# Patient Record
Sex: Female | Born: 1985 | Race: Black or African American | Hispanic: No | Marital: Single | Smoking: Never smoker
Health system: Southern US, Community
[De-identification: ages and names within clinical notes are randomized; demographics above are authoritative.]

## PROBLEM LIST (undated history)

## (undated) ENCOUNTER — Inpatient Hospital Stay (HOSPITAL_COMMUNITY): Payer: Self-pay

## (undated) DIAGNOSIS — J45909 Unspecified asthma, uncomplicated: Secondary | ICD-10-CM

## (undated) HISTORY — PX: NO PAST SURGERIES: SHX2092

---

## 2008-11-08 ENCOUNTER — Observation Stay: Payer: Self-pay

## 2008-11-14 ENCOUNTER — Observation Stay: Payer: Self-pay | Admitting: Unknown Physician Specialty

## 2009-01-03 ENCOUNTER — Observation Stay: Payer: Self-pay

## 2009-01-22 ENCOUNTER — Inpatient Hospital Stay: Payer: Self-pay

## 2010-09-01 ENCOUNTER — Inpatient Hospital Stay: Payer: Self-pay | Admitting: Obstetrics & Gynecology

## 2014-09-26 ENCOUNTER — Emergency Department: Payer: Self-pay | Admitting: Emergency Medicine

## 2014-12-07 ENCOUNTER — Emergency Department: Payer: Self-pay | Admitting: Emergency Medicine

## 2014-12-08 LAB — URINALYSIS, COMPLETE
Bilirubin,UR: NEGATIVE
Glucose,UR: NEGATIVE mg/dL (ref 0–75)
Ketone: NEGATIVE
NITRITE: NEGATIVE
Ph: 6 (ref 4.5–8.0)
SPECIFIC GRAVITY: 1.02 (ref 1.003–1.030)
WBC UR: 1632 /HPF (ref 0–5)

## 2014-12-09 LAB — URINE CULTURE

## 2015-05-08 ENCOUNTER — Emergency Department: Payer: Self-pay

## 2015-05-08 DIAGNOSIS — O200 Threatened abortion: Secondary | ICD-10-CM | POA: Insufficient documentation

## 2015-05-08 DIAGNOSIS — Z3A01 Less than 8 weeks gestation of pregnancy: Secondary | ICD-10-CM | POA: Insufficient documentation

## 2015-05-08 DIAGNOSIS — Y9289 Other specified places as the place of occurrence of the external cause: Secondary | ICD-10-CM | POA: Insufficient documentation

## 2015-05-08 DIAGNOSIS — W01198A Fall on same level from slipping, tripping and stumbling with subsequent striking against other object, initial encounter: Secondary | ICD-10-CM | POA: Insufficient documentation

## 2015-05-08 DIAGNOSIS — Z043 Encounter for examination and observation following other accident: Secondary | ICD-10-CM | POA: Insufficient documentation

## 2015-05-08 DIAGNOSIS — Y998 Other external cause status: Secondary | ICD-10-CM | POA: Insufficient documentation

## 2015-05-08 DIAGNOSIS — Y9389 Activity, other specified: Secondary | ICD-10-CM | POA: Insufficient documentation

## 2015-05-08 LAB — URINALYSIS COMPLETE WITH MICROSCOPIC (ARMC ONLY)
BILIRUBIN URINE: NEGATIVE
Bacteria, UA: NONE SEEN
Glucose, UA: NEGATIVE mg/dL
Hgb urine dipstick: NEGATIVE
Nitrite: NEGATIVE
PROTEIN: NEGATIVE mg/dL
SPECIFIC GRAVITY, URINE: 1.027 (ref 1.005–1.030)
pH: 6 (ref 5.0–8.0)

## 2015-05-08 LAB — CBC
HEMATOCRIT: 39.8 % (ref 35.0–47.0)
HEMOGLOBIN: 13 g/dL (ref 12.0–16.0)
MCH: 27.7 pg (ref 26.0–34.0)
MCHC: 32.7 g/dL (ref 32.0–36.0)
MCV: 84.9 fL (ref 80.0–100.0)
Platelets: 255 10*3/uL (ref 150–440)
RBC: 4.69 MIL/uL (ref 3.80–5.20)
RDW: 13.2 % (ref 11.5–14.5)
WBC: 10.6 10*3/uL (ref 3.6–11.0)

## 2015-05-08 LAB — POCT PREGNANCY, URINE: Preg Test, Ur: POSITIVE — AB

## 2015-05-08 NOTE — ED Notes (Signed)
Pt presents to ER alert and in NAD. Pt states she thinks she is pregnant. Pt states she has been having vaginal bleeding since fall. Pt has not sought medical care for pregnancy.

## 2015-05-09 ENCOUNTER — Emergency Department
Admission: EM | Admit: 2015-05-09 | Discharge: 2015-05-09 | Disposition: A | Discharge status: Home / Self Care | Payer: Self-pay | Attending: Emergency Medicine | Admitting: Emergency Medicine

## 2015-05-09 DIAGNOSIS — O209 Hemorrhage in early pregnancy, unspecified: Secondary | ICD-10-CM

## 2015-05-09 DIAGNOSIS — O200 Threatened abortion: Secondary | ICD-10-CM

## 2015-05-09 LAB — ABO/RH
ABO/RH(D): O POS
ABO/RH(D): O POS

## 2015-05-09 LAB — HCG, QUANTITATIVE, PREGNANCY: hCG, Beta Chain, Quant, S: 8919 m[IU]/mL — ABNORMAL HIGH (ref <5)

## 2015-05-09 MED ORDER — PRENATAL VITAMINS 0.8 MG PO TABS: 1.0000 | ORAL_TABLET | Freq: Every day | ORAL | Status: DC

## 2015-05-09 NOTE — ED Notes (Signed)
Discharge instructions given, pt to drive self home. RX given x 1

## 2015-05-09 NOTE — ED Notes (Signed)
Pt stated she was washing her hair in the shower when she fell tonight. Pt stated that she is pregnant, but unknown how many weeks along.

## 2015-05-09 NOTE — ED Provider Notes (Signed)
[facility] Emergency Department Provider Note  ____________________________________________  Time seen: Approximately 4: 00 AM  I have reviewed the triage vital signs and the nursing notes.   HISTORY  Chief Complaint Fall    HPI Patricia Guerrero is a 29 y.o. female without any medical problems who presents with 1 day of vaginal spotting after a slip and near fall in the shower. Patient says that she was in the shower yesterday when slipped on some of her hair on the bottom of the tub. She said that she was able to stabilize herself before hitting the ground. She did not lose consciousness. She does not complain of any pain at this time. However, she did stated she started to have vaginal spotting afterwards. No belly pain, fluid leakage, discharge or nausea or vomiting. Is concerned that she may be pregnant. Patient is a G3 P2 with 2 previous vaginal deliveries.   No past medical history on file.  There are no active problems to display for this patient.   No past surgical history on file.  No current outpatient prescriptions on file.  Allergies Review of patient's allergies indicates no known allergies.  No family history on file.  Social History History  Substance Use Topics  . Smoking status: Not on file  . Smokeless tobacco: Not on file  . Alcohol Use: Not on file    Review of Systems Constitutional: No fever/chills Eyes: No visual changes. ENT: No sore throat. Cardiovascular: Denies chest pain. Respiratory: Denies shortness of breath. Gastrointestinal: No abdominal pain.  No nausea, no vomiting.  No diarrhea.  No constipation. Genitourinary: Negative for dysuria. Musculoskeletal: Negative for back pain. Skin: Negative for rash. Neurological: Negative for headaches, focal weakness or numbness.  10-point ROS otherwise negative.  ____________________________________________   PHYSICAL EXAM:  VITAL SIGNS: ED Triage Vitals  Enc  Vitals Group     BP 05/08/15 2219 123/75 mmHg     Pulse Rate 05/08/15 2219 74     Resp 05/08/15 2219 18     Temp 05/08/15 2219 98.1 F (36.7 C)     Temp Source 05/08/15 2219 Oral     SpO2 05/08/15 2219 98 %     Weight 05/08/15 2219 150 lb (68.04 kg)     Height 05/08/15 2219 5\' 8"  (1.727 m)     Head Cir --      Peak Flow --      Pain Score --      Pain Loc --      Pain Edu? --      Excl. in GC? --     Constitutional: Alert and oriented. Well appearing and in no acute distress. Eyes: Conjunctivae are normal. PERRL. EOMI. Head: Atraumatic. Nose: No congestion/rhinnorhea. Mouth/Throat: Mucous membranes are moist.  Oropharynx non-erythematous. Neck: No stridor.   Cardiovascular: Normal rate, regular rhythm. Grossly normal heart sounds.  Good peripheral circulation. Respiratory: Normal respiratory effort.  No retractions. Lungs CTAB. Gastrointestinal: Soft and nontender. No distention. No abdominal bruits. No CVA tenderness. Genitourinary: Normal external exam. Bimanual exam with closed cervix. No CMT no uterine or adnexal tenderness palpation. Speculum exam not done secondary to point of discharge. No blood or discharge on the glove at completion of exam.  Musculoskeletal: No lower extremity tenderness nor edema.  No joint effusions. Neurologic:  Normal speech and language. No gross focal neurologic deficits are appreciated. Speech is normal. No gait instability. Skin:  Skin is warm, dry and intact. No rash noted. Psychiatric: Mood and affect  are normal. Speech and behavior are normal.  ____________________________________________   LABS (all labs ordered are listed, but only abnormal results are displayed)  Labs Reviewed  HCG, QUANTITATIVE, PREGNANCY - Abnormal; Notable for the following:    hCG, Beta Chain, Quant, S 8919 (*)    All other components within normal limits  URINALYSIS COMPLETEWITH MICROSCOPIC ([facility] ONLY) - Abnormal; Notable for the following:    Color, Urine  YELLOW (*)    APPearance CLEAR (*)    Ketones, ur 1+ (*)    Leukocytes, UA TRACE (*)    Squamous Epithelial / LPF 0-5 (*)    All other components within normal limits  POCT PREGNANCY, URINE - Abnormal; Notable for the following:    Preg Test, Ur POSITIVE (*)    All other components within normal limits  CBC  ABO/RH  ABO/RH   ____________________________________________  EKG   ____________________________________________  RADIOLOGY  Single intrauterine gestation at 5 weeks and 2 days. Small subchorionic hematoma. ____________________________________________   PROCEDURES    ____________________________________________   INITIAL IMPRESSION / ASSESSMENT AND PLAN / ED COURSE  Pertinent labs & imaging results that were available during my care of the patient were reviewed by me and considered in my medical decision making (see chart for details).  Explained the patient results of ultrasound including the subchorionic hemorrhage. Aware that she may continue to bleed. The bleeding may stop and never happen again. Also aware that this may progress to miscarriage. We'll discharge patient with prenatal vitamins. Follow-up at [facility] where she has been seen in the past. ____________________________________________   FINAL CLINICAL IMPRESSION(S) / ED DIAGNOSES  Acute threatened abortion. Initial visit    Myrna Blazer, MD 05/09/15 [phone]

## 2015-05-09 NOTE — Discharge Instructions (Signed)

## 2015-05-09 NOTE — ED Notes (Signed)
MD at bedside. 

## 2015-06-11 DIAGNOSIS — Z331 Pregnant state, incidental: Secondary | ICD-10-CM | POA: Insufficient documentation

## 2015-07-09 DIAGNOSIS — Z8619 Personal history of other infectious and parasitic diseases: Secondary | ICD-10-CM | POA: Insufficient documentation

## 2015-07-30 DIAGNOSIS — R8271 Bacteriuria: Secondary | ICD-10-CM | POA: Insufficient documentation

## 2015-08-04 ENCOUNTER — Encounter: Payer: Self-pay | Admitting: Emergency Medicine

## 2015-08-04 ENCOUNTER — Emergency Department
Admission: EM | Admit: 2015-08-04 | Discharge: 2015-08-04 | Disposition: A | Discharge status: Home / Self Care | Payer: Medicaid Other | Attending: Emergency Medicine | Admitting: Emergency Medicine

## 2015-08-04 ENCOUNTER — Emergency Department: Payer: Medicaid Other

## 2015-08-04 DIAGNOSIS — R109 Unspecified abdominal pain: Secondary | ICD-10-CM | POA: Diagnosis not present

## 2015-08-04 DIAGNOSIS — O26899 Other specified pregnancy related conditions, unspecified trimester: Secondary | ICD-10-CM

## 2015-08-04 DIAGNOSIS — O9989 Other specified diseases and conditions complicating pregnancy, childbirth and the puerperium: Secondary | ICD-10-CM | POA: Diagnosis present

## 2015-08-04 DIAGNOSIS — Z3A17 17 weeks gestation of pregnancy: Secondary | ICD-10-CM | POA: Diagnosis not present

## 2015-08-04 DIAGNOSIS — Z79899 Other long term (current) drug therapy: Secondary | ICD-10-CM | POA: Diagnosis not present

## 2015-08-04 HISTORY — DX: Unspecified asthma, uncomplicated: J45.909

## 2015-08-04 LAB — URINALYSIS COMPLETE WITH MICROSCOPIC (ARMC ONLY)
Bilirubin Urine: NEGATIVE
Glucose, UA: NEGATIVE mg/dL
Hgb urine dipstick: NEGATIVE
Nitrite: NEGATIVE
PH: 5 (ref 5.0–8.0)
PROTEIN: NEGATIVE mg/dL
Specific Gravity, Urine: 1.028 (ref 1.005–1.030)

## 2015-08-04 LAB — HCG, QUANTITATIVE, PREGNANCY: hCG, Beta Chain, Quant, S: 9041 m[IU]/mL — ABNORMAL HIGH (ref <5)

## 2015-08-04 NOTE — ED Notes (Signed)
Patient to ED with report of 16-[redacted] weeks pregnant with sharp abdominal cramping last night and then heavy vaginal discharge today.

## 2015-08-04 NOTE — ED Provider Notes (Signed)
[facility] Emergency Department Provider Note  ____________________________________________  Time seen: 1805  I have reviewed the triage vital signs and the nursing notes.   HISTORY  Chief Complaint Abdominal Cramping and Vaginal Discharge     HPI Patricia Guerrero is a 29 y.o. female  who is pregnant for the third time and is currently [redacted] weeks along. She reports she began to have lower abdominal cramping last night and through the day. It has improved.   This morning, when she woke up, she went to urinate and when she White, she noticed an increased amount of white discharge. She denies a flow of any sort of liquid. She denies any bleeding. She denies dysuria.  Her prior 2 pregnancies were both full-term without complication.    Past Medical History  Diagnosis Date  . Asthma     There are no active problems to display for this patient.   History reviewed. No pertinent past surgical history.  Current Outpatient Rx  Name  Route  Sig  Dispense  Refill  . amoxicillin (AMOXIL) 500 MG capsule   Oral   Take 500 mg by mouth 3 (three) times daily.         . Prenatal Multivit-Min-Fe-FA (PRENATAL VITAMINS) 0.8 MG tablet   Oral   Take 1 tablet by mouth daily.   30 tablet   0     Allergies Review of patient's allergies indicates no known allergies.  History reviewed. No pertinent family history.  Social History Social History  Substance Use Topics  . Smoking status: Never Smoker   . Smokeless tobacco: None  . Alcohol Use: No    Review of Systems  Constitutional: Negative for fever. ENT: Negative for sore throat. Cardiovascular: Negative for chest pain. Respiratory: History of asthma, Negative for shortness of breath. Gastrointestinal: Negative for abdominal pain, vomiting and diarrhea. Genitourinary: Pregnant, 17 weeks, with cramping. See history of present illness Musculoskeletal: No myalgias or injuries. Skin: Negative for  rash. Neurological: Negative for headaches   10-point ROS otherwise negative.  ____________________________________________   PHYSICAL EXAM:  VITAL SIGNS: ED Triage Vitals  Enc Vitals Group     BP 08/04/15 1714 133/82 mmHg     Pulse Rate 08/04/15 1714 89     Resp 08/04/15 1714 20     Temp 08/04/15 1714 98.6 F (37 C)     Temp Source 08/04/15 1714 Oral     SpO2 08/04/15 1714 100 %     Weight 08/04/15 1714 146 lb (66.225 kg)     Height 08/04/15 1714  (1.753 m)     Head Cir --      Peak Flow --      Pain Score 08/04/15 1715 7     Pain Loc --      Pain Edu? --      Excl. in GC? --     Constitutional: Alert and oriented. Well appearing and in no distress. ENT   Head: Normocephalic and atraumatic.   Nose: No congestion/rhinnorhea. Cardiovascular: Normal rate, regular rhythm, no murmur noted Respiratory:  Normal respiratory effort, no tachypnea.    Breath sounds are clear and equal bilaterally.  Gastrointestinal: Soft, gravid consistent with dates, mild tenderness equal bilaterally in the lower abdomen..  Back: No muscle spasm, no tenderness, no CVA tenderness. Musculoskeletal: No deformity noted. Nontender with normal range of motion in all extremities.  No noted edema. Neurologic:  Normal speech and language. No gross focal neurologic deficits are appreciated.  Skin:  Skin is warm, dry. No rash noted. Psychiatric: Mood and affect are normal. Speech and behavior are normal.  ____________________________________________    LABS (pertinent positives/negatives)  HCG level of 9041  UA: No white blood cells. 1+ ketones. Leukocyte esterase 3+. ____________________________________________    RADIOLOGY  Pelvic ultrasound:  IMPRESSION: 1. Single live intrauterine pregnancy with a measured gestational age of [redacted] weeks and 1 day 2. Placental previa. 3. No other  abnormality.  ____________________________________________  ____________________________________________   INITIAL IMPRESSION / ASSESSMENT AND PLAN / ED COURSE  Pertinent labs & imaging results that were available during my care of the patient were reviewed by me and considered in my medical decision making (see chart for details).  41 her old female G3 P2 A0 now at 17 weeks. She is having lower abdominal cramping. Her hCG level is approximately 9000. This is essentially the same level as it was when she was 5 weeks and seen here previously with an ultrasound that showed an IUP. This is worrisome for a pregnancy that has failed to progress. We will obtain an ultrasound to further evaluate the pregnancy.  Of note, on her prior visit she was O positive for blood type.  ----------------------------------------- 8:22 PM on 08/04/2015 -----------------------------------------  The ultrasound shows a viable pregnancy at 17 weeks 2 days. I have offered the patient reassurance after likely having made her quite nervous as we discussed her hCG level and my concerns for her pregnancy.   ----------------------------------------- 9:39 PM on 08/04/2015 -----------------------------------------  Pelvic exam: Normal external female genitalia. Patient has slightly heavy but physiologic discharge consistent with the discharge of pregnancy. No blood. No thin secretions consistent with rupture of membranes. No tenderness on exam.   ____________________________________________   FINAL CLINICAL IMPRESSION(S) / ED DIAGNOSES  Final diagnoses:  Abdominal pain in pregnancy  placenta previa    Darien Ramus, MD 08/04/15 2140

## 2015-08-04 NOTE — ED Notes (Signed)
Patient began having lower abdominal cramping last night. After urinating this AM, had heavy amount of clear/white thick discharge. Still having some discharge when urinating. Denies any UTI symptoms.

## 2015-08-04 NOTE — Discharge Instructions (Signed)
While your pregnancy hormone seemed low (9000), your ultrasound looks good with a gestational age of [redacted] weeks 2 days and normal fetal heart activity. The ultrasound did show that you have placenta previa. Follow-up with your obstetrician's at [facility]. Return to the emergency department if you have worsening cramping, if you have vaginal bleeding, or if you have other urgent concerns.  Abdominal Pain During Pregnancy Abdominal pain is common in pregnancy. Most of the time, it does not cause harm. There are many causes of abdominal pain. Some causes are more serious than others. Some of the causes of abdominal pain in pregnancy are easily diagnosed. Occasionally, the diagnosis takes time to understand. Other times, the cause is not determined. Abdominal pain can be a sign that something is very wrong with the pregnancy, or the pain may have nothing to do with the pregnancy at all. For this reason, always tell your health care provider if you have any abdominal discomfort. HOME CARE INSTRUCTIONS  Monitor your abdominal pain for any changes. The following actions may help to alleviate any discomfort you are experiencing:  Do not have sexual intercourse or put anything in your vagina until your symptoms go away completely.  Get plenty of rest until your pain improves.  Drink clear fluids if you feel nauseous. Avoid solid food as long as you are uncomfortable or nauseous.  Only take over-the-counter or prescription medicine as directed by your health care provider.  Keep all follow-up appointments with your health care provider. SEEK IMMEDIATE MEDICAL CARE IF:  You are bleeding, leaking fluid, or passing tissue from the vagina.  You have increasing pain or cramping.  You have persistent vomiting.  You have painful or bloody urination.  You have a fever.  You notice a decrease in your baby's movements.  You have extreme weakness or feel faint.  You have shortness of breath, with or without  abdominal pain.  You develop a severe headache with abdominal pain.  You have abnormal vaginal discharge with abdominal pain.  You have persistent diarrhea.  You have abdominal pain that continues even after rest, or gets worse. MAKE SURE YOU:   Understand these instructions.  Will watch your condition.  Will get help right away if you are not doing well or get worse. Document Released: 11/28/2005 Document Revised: 09/18/2013 Document Reviewed: 06/27/2013 [facility] Patient Information 2015 [place], [place]. This information is not intended to replace advice given to you by your health care provider. Make sure you discuss any questions you have with your health care provider.

## 2015-08-04 NOTE — ED Notes (Signed)
Pt went to ultrasound.

## 2015-08-27 DIAGNOSIS — N764 Abscess of vulva: Secondary | ICD-10-CM | POA: Insufficient documentation

## 2015-11-24 ENCOUNTER — Encounter: Payer: Self-pay | Admitting: Family Medicine

## 2015-11-24 ENCOUNTER — Ambulatory Visit (INDEPENDENT_AMBULATORY_CARE_PROVIDER_SITE_OTHER): Payer: Medicaid Other | Admitting: Family Medicine

## 2015-11-24 VITALS — BP 120/70 | HR 82 | Temp 99.2°F | Resp 16 | Ht 69.0 in | Wt 153.2 lb

## 2015-11-24 DIAGNOSIS — Z331 Pregnant state, incidental: Secondary | ICD-10-CM

## 2015-11-24 DIAGNOSIS — J029 Acute pharyngitis, unspecified: Secondary | ICD-10-CM

## 2015-11-24 DIAGNOSIS — Z3490 Encounter for supervision of normal pregnancy, unspecified, unspecified trimester: Secondary | ICD-10-CM

## 2015-11-24 DIAGNOSIS — J4520 Mild intermittent asthma, uncomplicated: Secondary | ICD-10-CM

## 2015-11-24 LAB — POCT RAPID STREP A (OFFICE): Rapid Strep A Screen: NEGATIVE

## 2015-11-24 MED ORDER — ALBUTEROL SULFATE HFA 108 (90 BASE) MCG/ACT IN AERS: 2.0000 | INHALATION_SPRAY | Freq: Four times a day (QID) | RESPIRATORY_TRACT | Status: DC | PRN

## 2015-11-24 NOTE — Progress Notes (Signed)
Name: Patricia Guerrero   MRN: [id number]    DOB: 1986/11/02   Date:11/24/2015       Progress Note  Subjective  Chief Complaint  Chief Complaint  Patient presents with  . Sore Throat    for 3 days, Patient [redacted] weeks pregnant    HPI  Pharyngitis history of present illness  Patient is [redacted] weeks pregnant and presents with a three-day history of sore throat pain. She is febrile to 99.2 today and has been experiencing chills. She has some nasal drainage and discharge as well which is clear.  Asthma  Patient has a history of mild intermittent asthma. She is currently out of her inhaler she's been having minimal cough and wheezing.  Intrauterine pregnancy  Patient is currently pregnant [redacted] weeks. She states the pregnancy is going well and she has been followed at [facility] for this pregnancy. No complications or high risk for her report.    Past Medical History  Diagnosis Date  . Asthma     Social History  Substance Use Topics  . Smoking status: Never Smoker   . Smokeless tobacco: Not on file  . Alcohol Use: No     Current outpatient prescriptions:  .  Prenatal Multivit-Min-Fe-FA (PRENATAL VITAMINS) 0.8 MG tablet, Take 1 tablet by mouth daily., Disp: 30 tablet, Rfl: 0  No Known Allergies  Review of Systems  Constitutional: Positive for fever and chills. Negative for weight loss.  HENT: Positive for sore throat. Negative for congestion, hearing loss and tinnitus.   Eyes: Negative for blurred vision, double vision and redness.  Respiratory: Positive for cough and wheezing. Negative for hemoptysis and shortness of breath.   Cardiovascular: Negative for chest pain, palpitations, orthopnea, claudication and leg swelling.  Gastrointestinal: Negative for heartburn, nausea, vomiting, diarrhea, constipation and blood in stool.  Genitourinary: Negative for dysuria, urgency, frequency and hematuria.       33 weeks pregnancy  Musculoskeletal: Negative for myalgias, back pain,  joint pain, falls and neck pain.  Skin: Negative for itching.  Neurological: Negative for dizziness, tingling, tremors, focal weakness, seizures, loss of consciousness, weakness and headaches.  Endo/Heme/Allergies: Does not bruise/bleed easily.  Psychiatric/Behavioral: Negative for depression and substance abuse. The patient is not nervous/anxious and does not have insomnia.      Objective  Filed Vitals:   11/24/15 0856  BP: 120/70  Pulse: 82  Temp: 99.2 F (37.3 C)  TempSrc: Oral  Resp: 16  Height:  (1.753 m)  Weight: 153 lb 3.2 oz (69.491 kg)  SpO2: 98%     Physical Exam  Constitutional: She is oriented to person, place, and time and well-developed, well-nourished, and in no distress.  HENT:  Head: Normocephalic.  Mild pharyngeal injection without exudate  Eyes: EOM are normal. Pupils are equal, round, and reactive to light.  Neck: Normal range of motion. No thyromegaly present.  Cardiovascular: Normal rate, regular rhythm and normal heart sounds.   No murmur heard. Pulmonary/Chest: Effort normal and breath sounds normal.  Abdominal:  Gravid  Musculoskeletal: Normal range of motion. She exhibits no edema.  Lymphadenopathy:    She has no cervical adenopathy.  Neurological: She is alert and oriented to person, place, and time. No cranial nerve deficit. Gait normal.  Skin: Skin is warm and dry. No rash noted.  Psychiatric: Memory and affect normal.      Assessment & Plan   1. Sore throat Symptomatic treatment - POCT rapid strep A  2. Pharyngitis As above  3.  Mild intermittent asthma, uncomplicated Albuterol when necessary - albuterol (PROVENTIL HFA;VENTOLIN HFA) 108 (90 BASE) MCG/ACT inhaler; Inhale 2 puffs into the lungs every 6 (six) hours as needed for wheezing or shortness of breath.  Dispense: 1 Inhaler; Refill: 0  4. Pregnancy Going well and [facility]

## 2015-11-24 NOTE — Patient Instructions (Signed)

## 2015-11-28 LAB — ANAEROBIC AND AEROBIC CULTURE

## 2015-12-16 ENCOUNTER — Telehealth: Payer: Self-pay

## 2015-12-16 NOTE — Telephone Encounter (Signed)
Tried to contact this patient to see if she was interested in getting her influenza vaccine, but there was no answer. A message was left for her to give us a call when she got the chance.  

## 2015-12-20 ENCOUNTER — Observation Stay
Admission: EM | Admit: 2015-12-20 | Discharge: 2015-12-20 | Disposition: A | Discharge status: Home / Self Care | Payer: Medicaid Other | Attending: Certified Nurse Midwife | Admitting: Certified Nurse Midwife

## 2015-12-20 ENCOUNTER — Encounter: Payer: Self-pay | Admitting: *Deleted

## 2015-12-20 DIAGNOSIS — A6009 Herpesviral infection of other urogenital tract: Secondary | ICD-10-CM | POA: Diagnosis not present

## 2015-12-20 DIAGNOSIS — Y9389 Activity, other specified: Secondary | ICD-10-CM | POA: Diagnosis not present

## 2015-12-20 DIAGNOSIS — Y998 Other external cause status: Secondary | ICD-10-CM | POA: Insufficient documentation

## 2015-12-20 DIAGNOSIS — O98313 Other infections with a predominantly sexual mode of transmission complicating pregnancy, third trimester: Secondary | ICD-10-CM | POA: Diagnosis not present

## 2015-12-20 DIAGNOSIS — W002XXA Other fall from one level to another due to ice and snow, initial encounter: Secondary | ICD-10-CM | POA: Diagnosis not present

## 2015-12-20 DIAGNOSIS — Y9289 Other specified places as the place of occurrence of the external cause: Secondary | ICD-10-CM | POA: Insufficient documentation

## 2015-12-20 DIAGNOSIS — Z3A37 37 weeks gestation of pregnancy: Secondary | ICD-10-CM | POA: Diagnosis not present

## 2015-12-20 MED ORDER — LACTATED RINGERS IV SOLN: 500.0000 mL | INTRAVENOUS | Status: DC | PRN

## 2015-12-20 NOTE — Final Progress Note (Signed)
Physician Final Progress Note  Patient ID: Jeris Pentantoinette L Delpriore MRN: [id number] DOB/AGE: 28/03/1986 28 y.o.  Admit date: 12/20/2015 Admitting provider:  Jill Sideolleen L. Sharen HonesGutierrez, CNM Discharge date: 12/20/2015   Admission Diagnoses: IUP at 37 weeks, s/p fall  Discharge Diagnoses: same  Consults: none  Significant Findings/ Diagnostic Studies: 30 year old G5 P2022 with EDC=01/10/2016 presented at 37 weeks after slipping and having 2 falls since last night. Last night she slipped and fell into the snow on hands and knees and this Am at 0530 she slipped on ice and fell on buttock. She went to work after that and then began having some contractions this afternoon around 1-2 PM. Noticed spotting x1 when she wiped earlier. Feels contraction tightening at fundus and LUS, but is talking thru them.  Baby nicely active.  Has drunk very little water today. 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 Nonsmoker. Exam: BP 111/61 mmHg  Pulse 71  Temp(Src) 98.5 F (36.9 C) (Oral)  Resp 16  Ht 5\' 9"  (1.753 m)  Wt 69.4 kg (153 lb)  BMI 22.58 kg/m2  LMP  (LMP Unknown)  FHR: baseline 140 with accelerations to 160s to 170, moderate variability, no decelerations Toco: irregular contractions, q6, q9, q2, mild Abd: soft, NT Ultrasound: anterior placenta, appears normal, cephalic presentation Cervix: FT/TH/-1 to 0. Discharge clear, no bleeding Blood type: O POS A: IUP at 37 weeks s/p fall x2. No evidence of fetal compromise P: Discussed POM with DR Tiburcio PeaHarris DC home with labor and abruption precautions  Procedures: none  Discharge Condition: stable  Disposition: 01-Home or Self Care  Diet: Regular diet  Discharge Activity: Activity as tolerated     Medication List    ASK your doctor about these medications        albuterol 108 (90 Base) MCG/ACT inhaler  Commonly known as:  PROVENTIL  HFA;VENTOLIN HFA  Inhale 2 puffs into the lungs every 6 (six) hours as needed for wheezing or shortness of breath.     Prenatal Vitamins 0.8 MG tablet  Take 1 tablet by mouth daily.          FU at next ROB visit  Signed: Farrel ConnersGUTIERREZ, Porfiria Heinrich 12/20/2015, 5:28 PM

## 2015-12-20 NOTE — OB Triage Note (Signed)
S/P fall X 2. Fell yesterday, 12/19/15 landing on abdomen. Did not seek medical care. Larey SeatFell today landing on her bottom. Reports feeling baby move. Fetal movement audible. Reports contractions. Denies bleeding or leaking of fluid. Does report having vaginal discharge. Elaina HoopsElks, Patricia Guerrero S

## 2016-07-28 IMAGING — US US OB LIMITED
1 series · 14 of 28 positions shown · non-contrast
Comparison: 05/08/2015

CLINICAL DATA: Abdominal cramping.  [REDACTED] weeks pregnant.

EXAM:
LIMITED OBSTETRIC ULTRASOUND

[Series 1: us ob limited · 0.21mm/px · 14 of 30 slices shown]
[im 2/30]
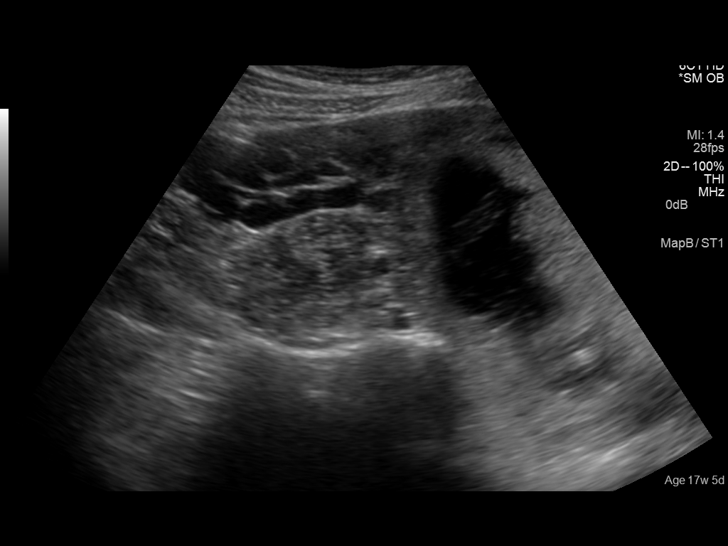
[im 4/30]
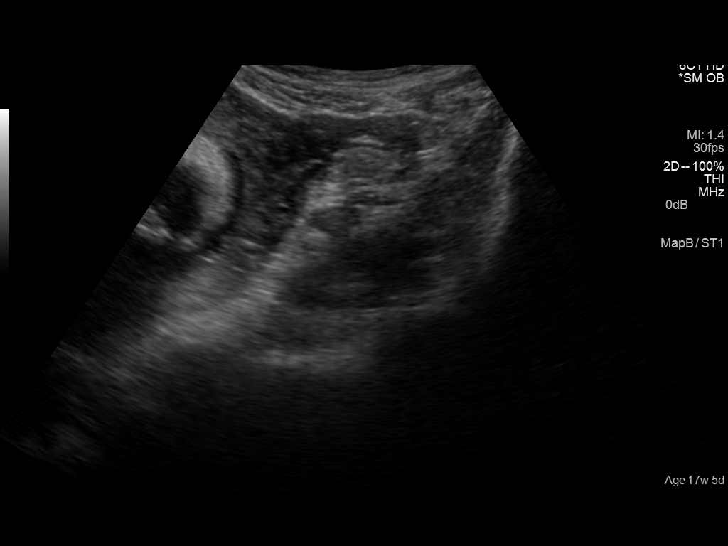
[im 6/30]
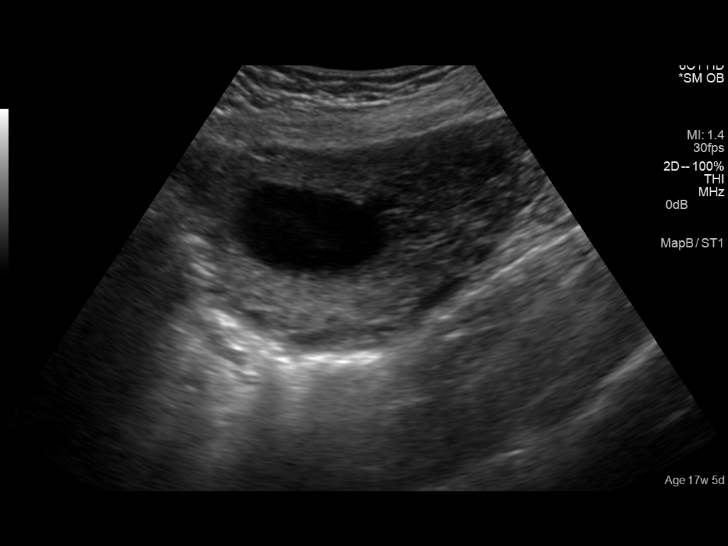
[im 8/30]
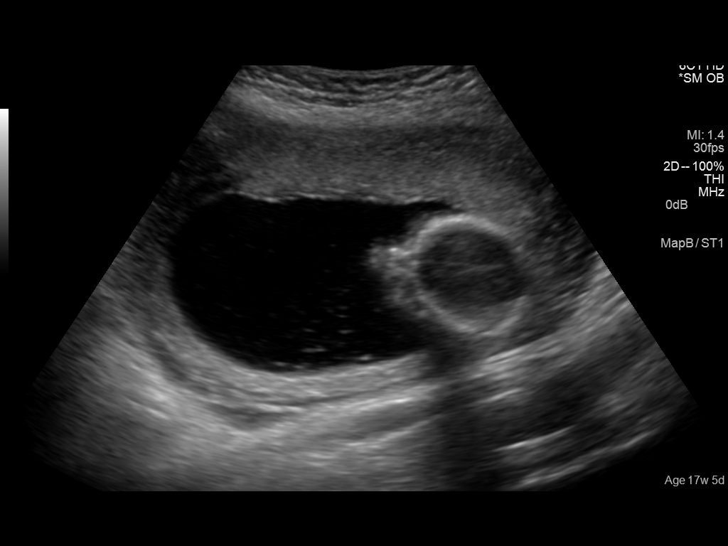
[im 10/30]
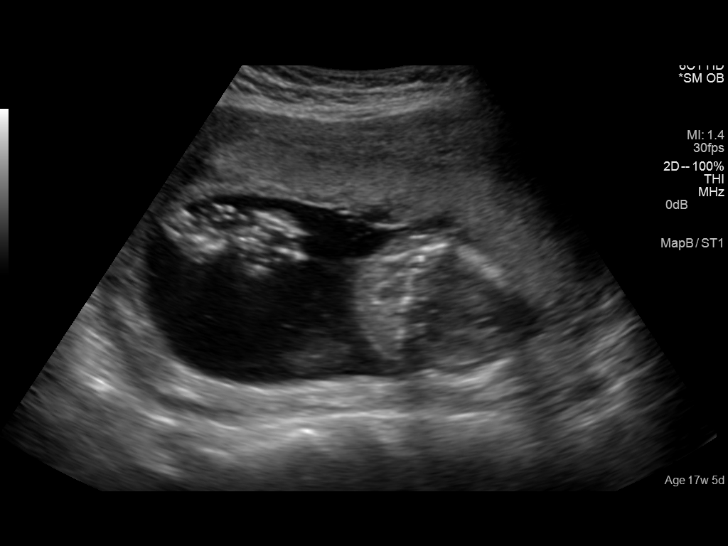
[im 12/30]
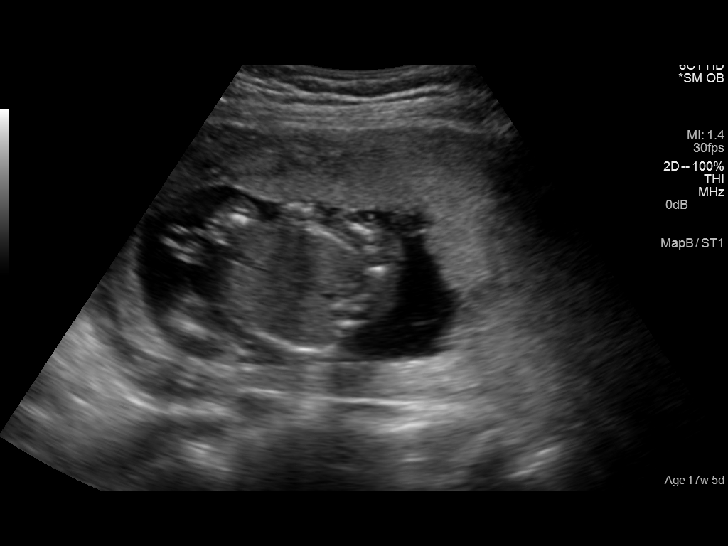
[im 14/30]
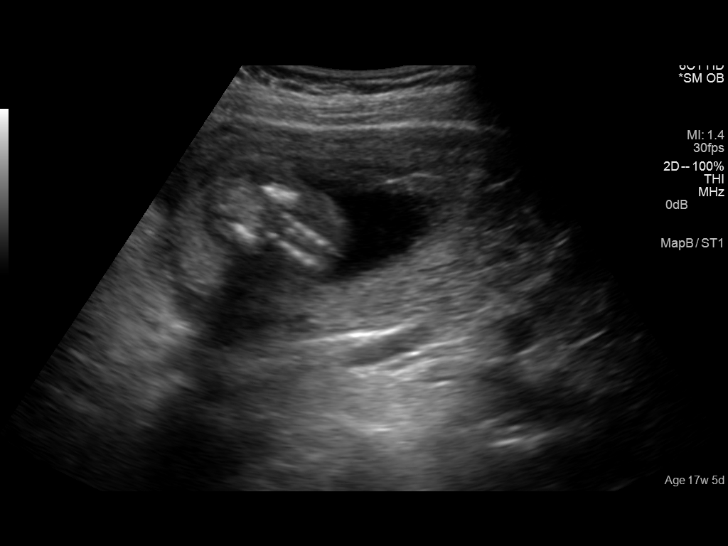
[im 17/30]
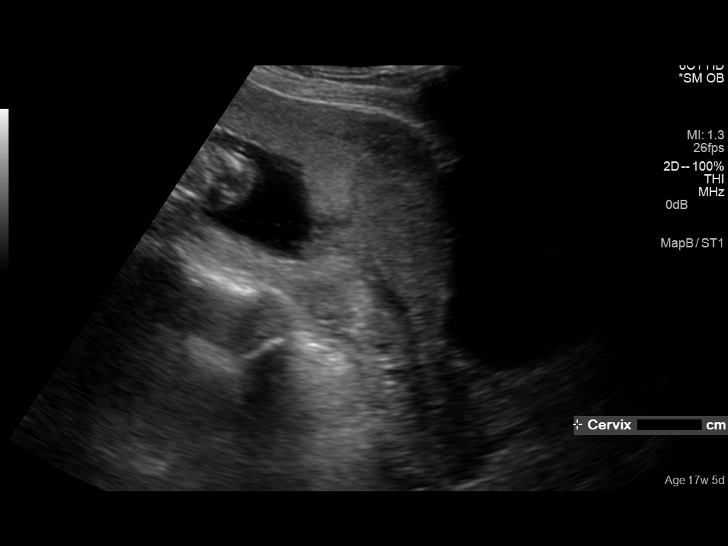
[im 19/30]
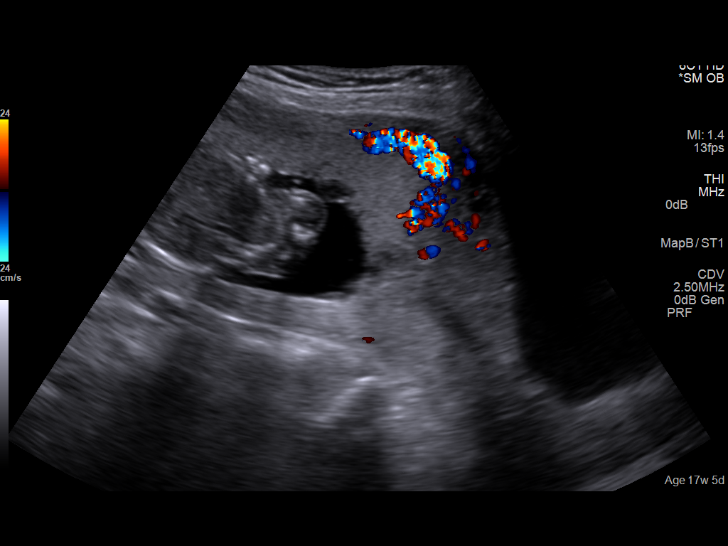
[im 21/30]
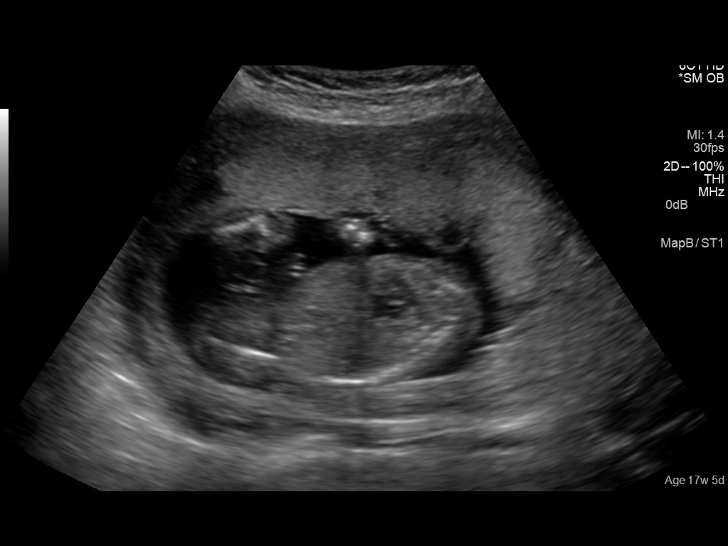
[im 23/30]
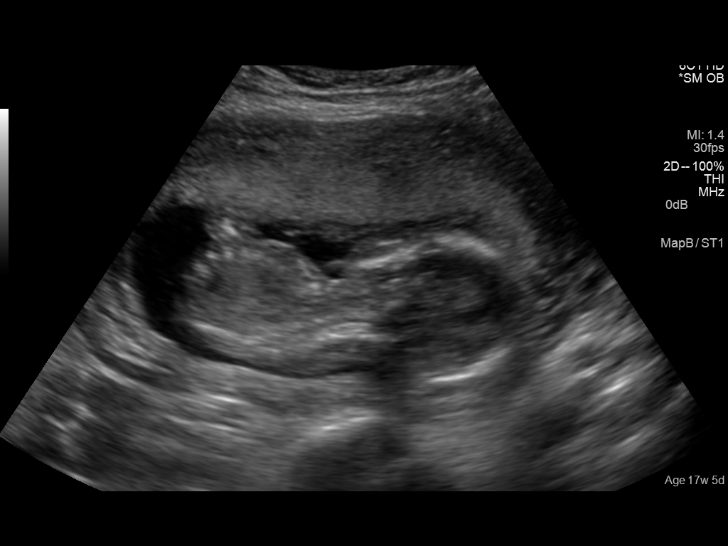
[im 25/30]
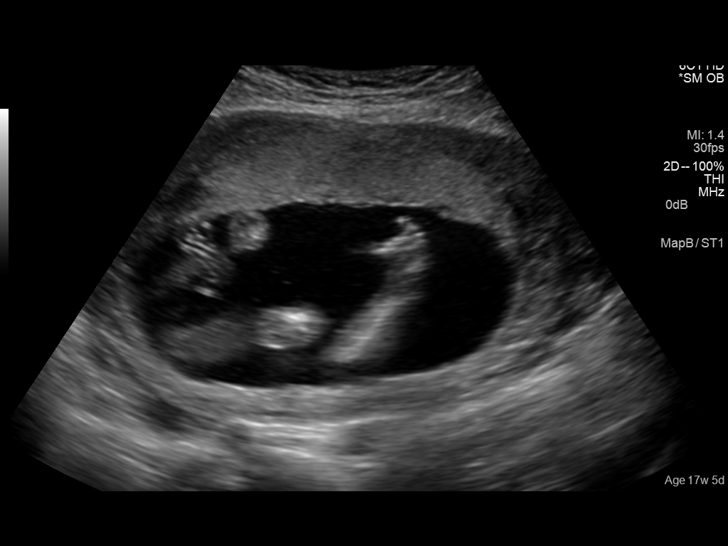
[im 27/30]
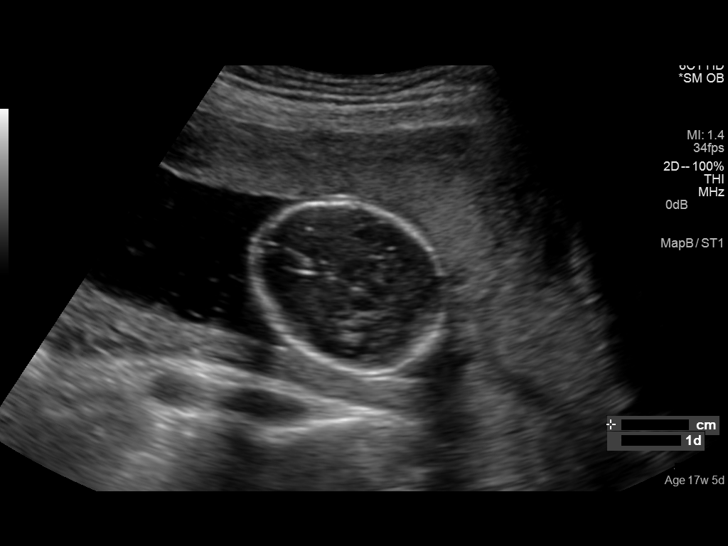
[im 30/30]
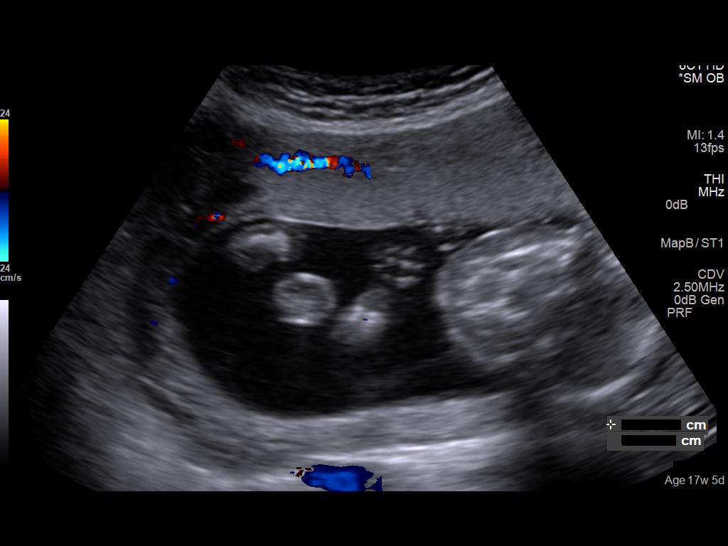

[14 of 28 positions shown; findings below may reference images not displayed]

FINDINGS: Number of Fetuses: 1

Heart Rate:  149 bpm

Movement: Yes

Presentation: Cephalic

Placental Location: Anterior

Previa: Yes, complete covering the internal cervical os. No evidence
of a retroplacental hemorrhage.

Amniotic Fluid (Subjective):  Within normal limits.

BPD:  3.6cm 17w  1d

MATERNAL FINDINGS:

Cervix:  Normal in length, measuring 4 cm.  Closed.

Uterus/Adnexae: No abnormality visualized. Ovaries not visualized.
No free fluid.
IMPRESSION: 1. Single live intrauterine pregnancy with a measured gestational
age of 17 weeks and 1 day
2. Placental previa.
3. No other abnormality.

This exam is performed on an emergent basis and does not
comprehensively evaluate fetal size, dating, or anatomy; follow-up
complete OB US should be considered if further fetal assessment is
warranted.

## 2016-10-24 ENCOUNTER — Encounter (HOSPITAL_COMMUNITY): Payer: Self-pay

## 2017-01-09 ENCOUNTER — Ambulatory Visit: Payer: Medicaid Other | Admitting: Family Medicine

## 2017-01-19 ENCOUNTER — Encounter: Payer: Self-pay | Admitting: Family Medicine

## 2017-01-19 ENCOUNTER — Ambulatory Visit (INDEPENDENT_AMBULATORY_CARE_PROVIDER_SITE_OTHER): Payer: Medicaid Other | Admitting: Family Medicine

## 2017-01-19 VITALS — BP 110/60 | HR 84 | Temp 98.3°F | Resp 16 | Ht 69.0 in | Wt 157.3 lb

## 2017-01-19 DIAGNOSIS — J209 Acute bronchitis, unspecified: Secondary | ICD-10-CM

## 2017-01-19 MED ORDER — AZITHROMYCIN 250 MG PO TABS: ORAL_TABLET | ORAL | 0 refills | Status: DC

## 2017-01-19 MED ORDER — BENZONATATE 200 MG PO CAPS: 200.0000 mg | ORAL_CAPSULE | Freq: Three times a day (TID) | ORAL | 0 refills | Status: DC | PRN

## 2017-01-19 NOTE — Progress Notes (Signed)
Name: Patricia Guerrero   MRN: [id number]    DOB: 05/19/1986   Date:01/19/2017       Progress Note  Subjective  Chief Complaint  Chief Complaint  Patient presents with  . Asthma    cough, headache, sweats, chills for 4 days. Inhaler not working     Cough  This is a new problem. The current episode started in the past 7 days. The problem has been unchanged. The cough is productive of sputum. Associated symptoms include chills, a fever (had a fever two days in a row) and sweats. Pertinent negatives include no myalgias, sore throat or shortness of breath. She has tried OTC cough suppressant (Delsym for cough, Ibuprofen for Headache.) for the symptoms.     Past Medical History:  Diagnosis Date  . Asthma     Past Surgical History:  Procedure Laterality Date  . NO PAST SURGERIES      No family history on file.  Social History   Social History  . Marital status: Married    Spouse name: N/A  . Number of children: N/A  . Years of education: N/A   Occupational History  . Not on file.   Social History Main Topics  . Smoking status: Never Smoker  . Smokeless tobacco: Never Used  . Alcohol use No  . Drug use: No  . Sexual activity: Yes   Other Topics Concern  . Not on file   Social History Narrative  . No narrative on file     Current Outpatient Prescriptions:  .  albuterol (PROVENTIL HFA;VENTOLIN HFA) 108 (90 BASE) MCG/ACT inhaler, Inhale 2 puffs into the lungs every 6 (six) hours as needed for wheezing or shortness of breath., Disp: 1 Inhaler, Rfl: 0  No Known Allergies   Review of Systems  Constitutional: Positive for chills and fever (had a fever two days in a row).  HENT: Negative for sore throat.   Respiratory: Positive for cough. Negative for shortness of breath.   Musculoskeletal: Negative for myalgias.     Objective  Vitals:   01/19/17 1052  BP: 110/60  Pulse: 84  Resp: 16  Temp: 98.3 F (36.8 C)  TempSrc: Oral  SpO2: 95%  Weight: 157 lb 4.8  oz (71.4 kg)  Height: 5\' 9"  (1.753 m)    Physical Exam  Constitutional: She is well-developed, well-nourished, and in no distress.  HENT:  Head: Normocephalic and atraumatic.  Mouth/Throat: No oropharyngeal exudate or posterior oropharyngeal erythema.  Cardiovascular: Normal rate, regular rhythm and normal heart sounds.   No murmur heard. Pulmonary/Chest: Effort normal and breath sounds normal. No respiratory distress. She has no wheezes. She has no rales.  Nursing note and vitals reviewed.     Assessment & Plan  1. Acute bronchitis, unspecified organism Obtain CXR to rule out pneumonia, started on azithromycin and benzonatate for treatment. - DG Chest 2 View; Future - azithromycin (ZITHROMAX) 250 MG tablet; 2 tabs po day 1, then 1 tab po q day x 4 days  Dispense: 6 tablet; Refill: 0 - benzonatate (TESSALON) 200 MG capsule; Take 1 capsule (200 mg total) by mouth 3 (three) times daily as needed for cough.  Dispense: 20 capsule; Refill: 0   Dannis Deroche Asad A. Faylene KurtzShah [facility] 01/19/2017 10:58 AM

## 2017-01-31 IMAGING — US US OB TRANSVAGINAL
1 series · 14 of 28 positions shown · non-contrast
Comparison: None.

CLINICAL DATA: Spotting in first-trimester pregnancy after fall.

EXAM:
OBSTETRIC <14 WK US AND TRANSVAGINAL OB US
TECHNIQUE: Both transabdominal and transvaginal ultrasound examinations were
performed for complete evaluation of the gestation as well as the
maternal uterus, adnexal regions, and pelvic cul-de-sac.
Transvaginal technique was performed to assess early pregnancy.

[Series 1: us ob transvaginal · 0.18mm/px · 14 of 103 slices shown]
[im 4/103]
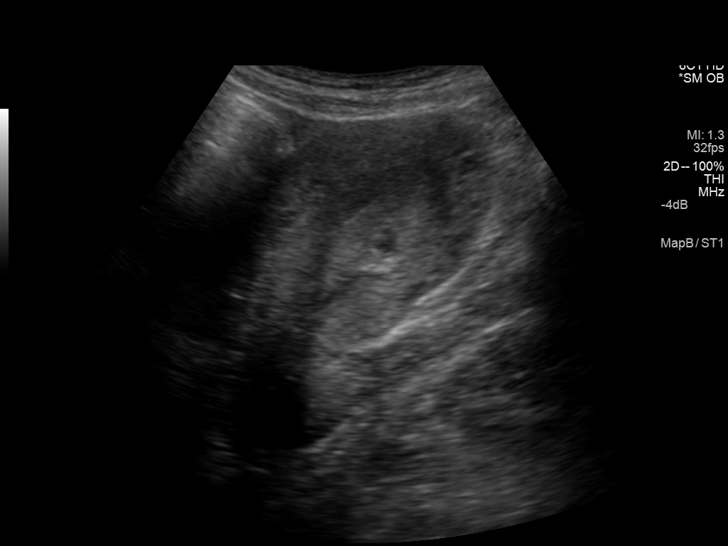
[im 12/103]
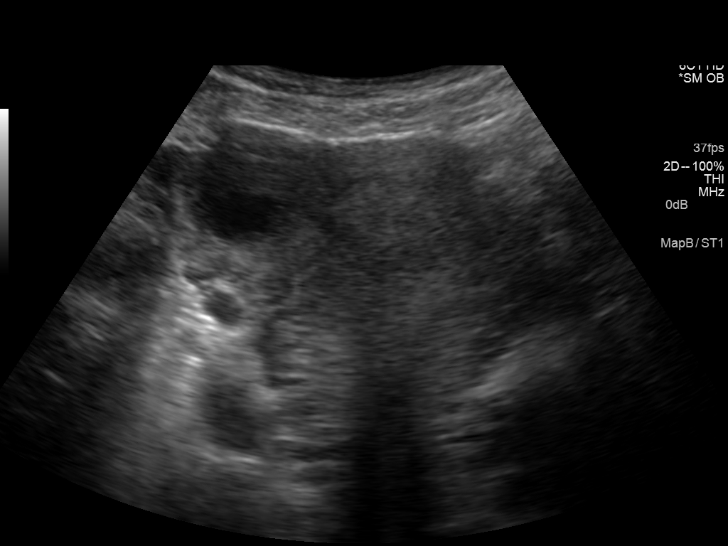
[im 19/103]
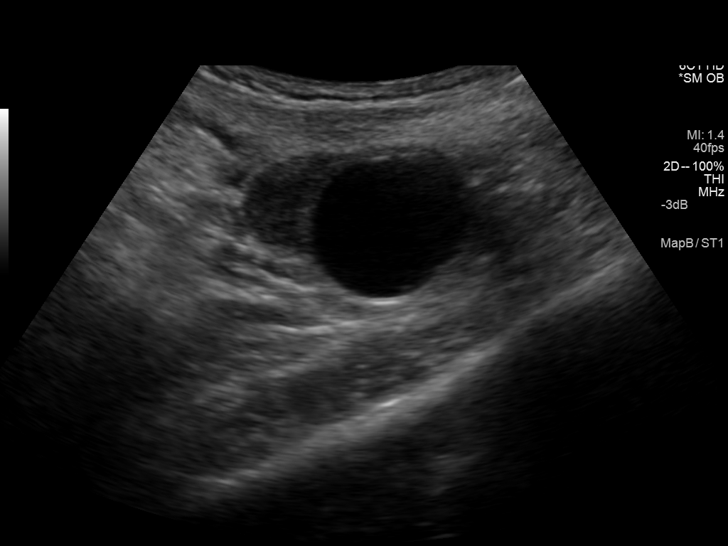
[im 27/103]
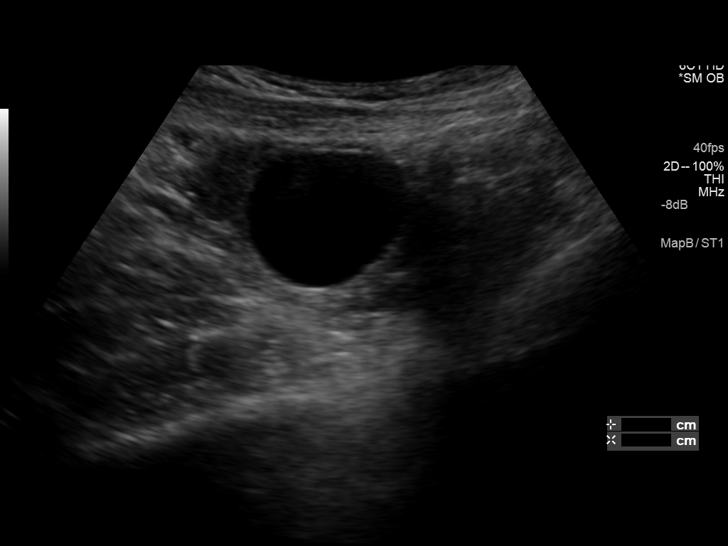
[im 35/103]
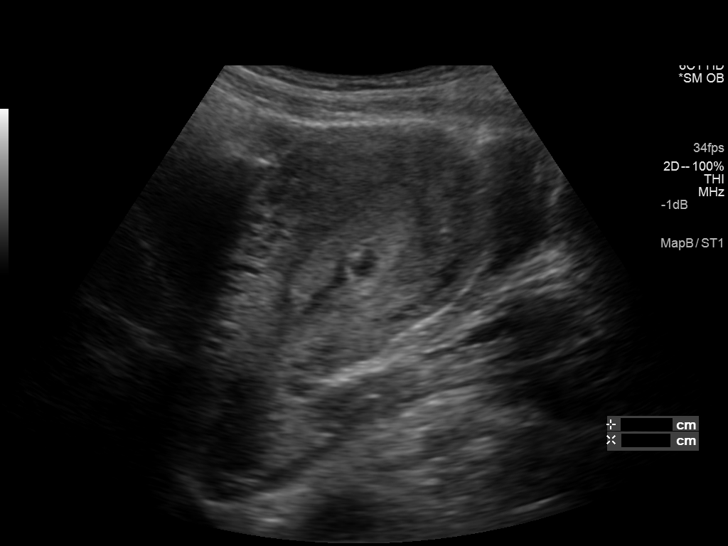
[im 42/103]
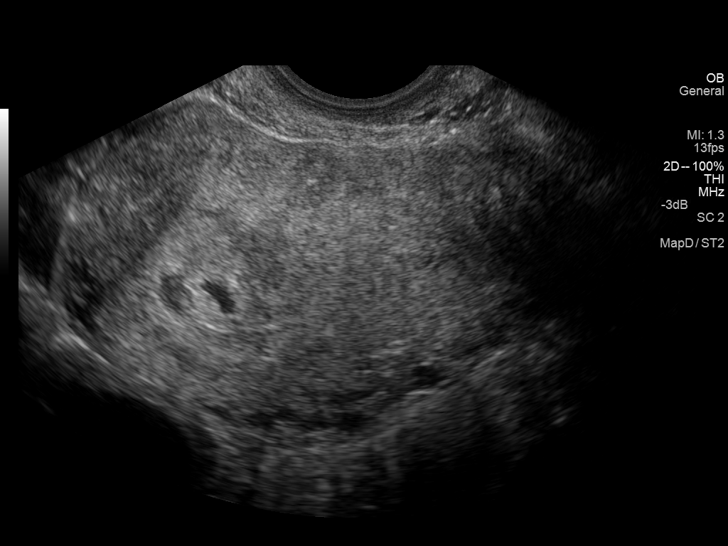
[im 50/103]
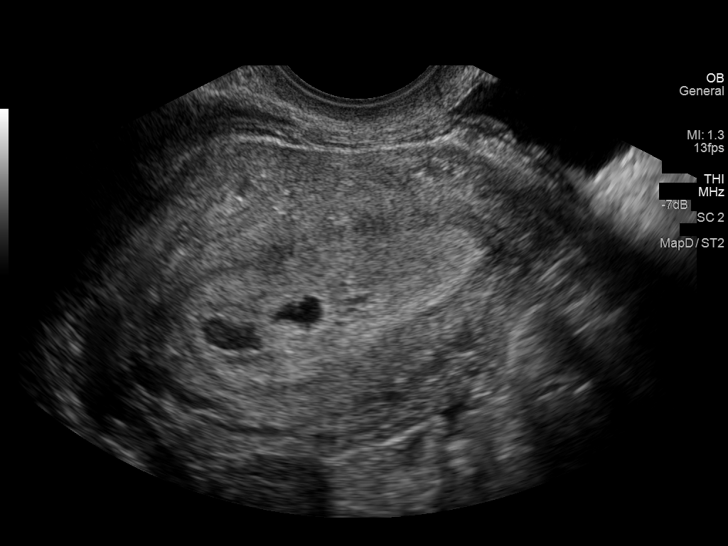
[im 57/103]
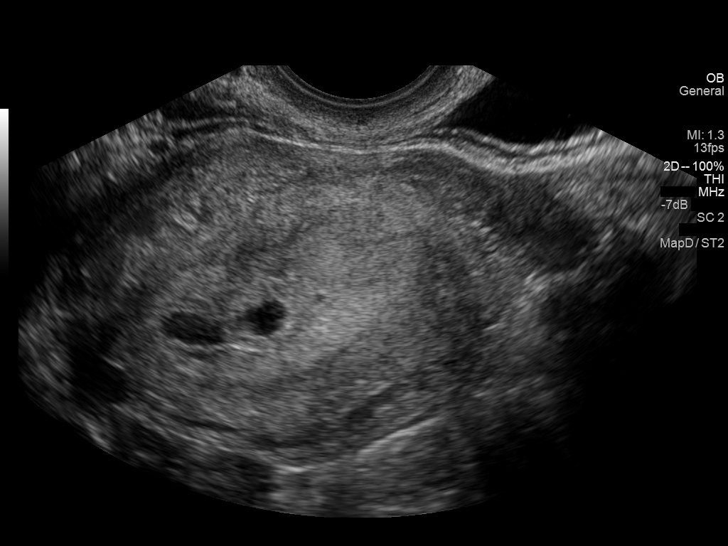
[im 65/103]
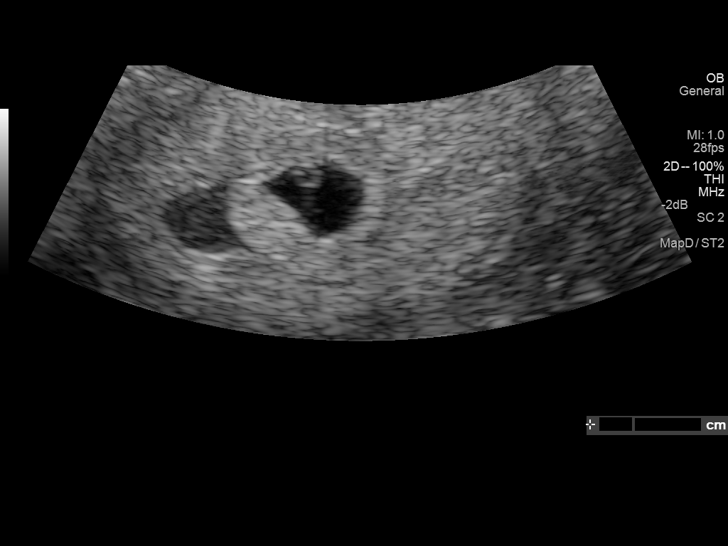
[im 72/103]
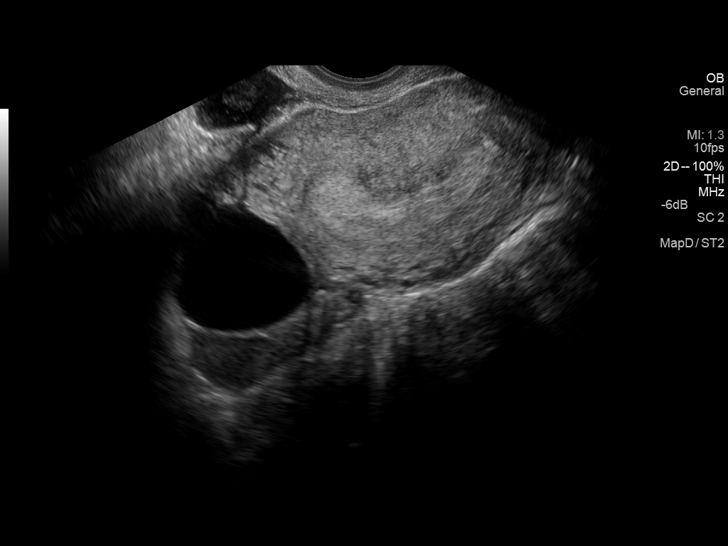
[im 80/103]
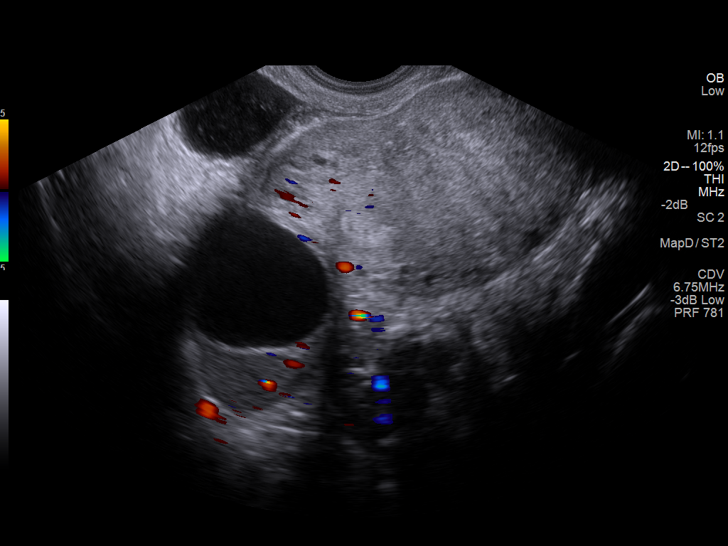
[im 87/103]
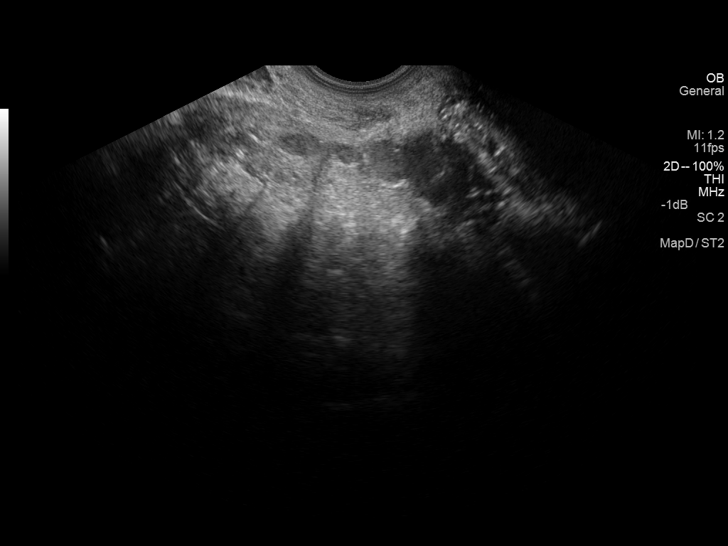
[im 95/103]
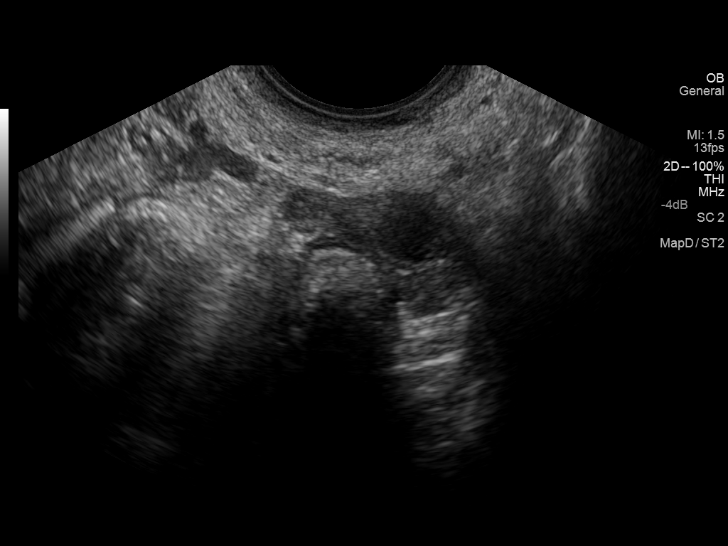
[im 103/103]
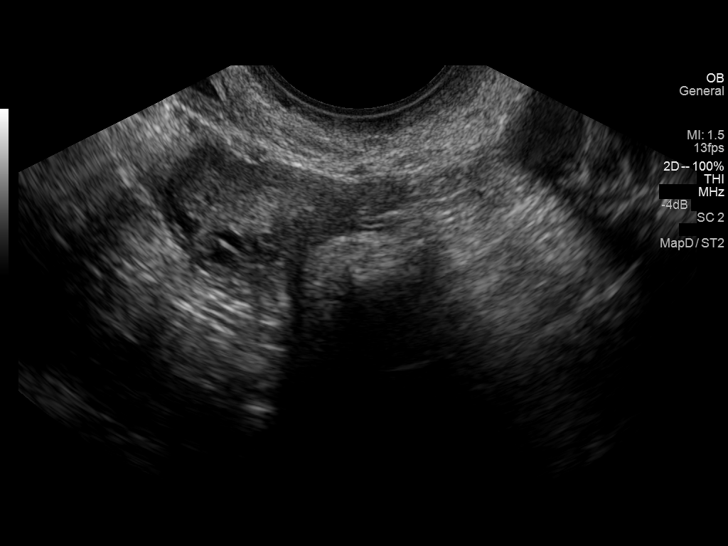

[14 of 28 positions shown; findings below may reference images not displayed]

FINDINGS: Intrauterine gestational sac: Visualized/normal in shape. There is a
small subchorionic hematoma measuring 17 mm in length by 5 mm in
thickness. The hematoma is touches less than 25% of the gestational
sac circumference.

Yolk sac:  Present

Embryo:  Not yet visible

MSD: 6  mm   5 w   2  d

Maternal uterus/adnexae: 3 cm simple cyst in the right ovary. The
left ovary is unremarkable. No free pelvic fluid.
IMPRESSION: 1. Single intrauterine gestation at 5 weeks 2 days.
2. Small subchorionic hematoma, as above.

## 2017-04-04 ENCOUNTER — Ambulatory Visit: Payer: Self-pay | Admitting: Family Medicine

## 2017-04-10 ENCOUNTER — Encounter: Payer: Self-pay | Admitting: Family Medicine

## 2017-04-10 ENCOUNTER — Ambulatory Visit (INDEPENDENT_AMBULATORY_CARE_PROVIDER_SITE_OTHER): Payer: Self-pay | Admitting: Family Medicine

## 2017-04-10 VITALS — BP 114/68 | HR 108 | Temp 98.2°F | Resp 16 | Ht 69.0 in | Wt 156.7 lb

## 2017-04-10 DIAGNOSIS — A6000 Herpesviral infection of urogenital system, unspecified: Secondary | ICD-10-CM | POA: Insufficient documentation

## 2017-04-10 DIAGNOSIS — J4520 Mild intermittent asthma, uncomplicated: Secondary | ICD-10-CM

## 2017-04-10 DIAGNOSIS — Z3201 Encounter for pregnancy test, result positive: Secondary | ICD-10-CM

## 2017-04-10 LAB — POCT URINE PREGNANCY: Preg Test, Ur: POSITIVE — AB

## 2017-04-10 MED ORDER — ALBUTEROL SULFATE HFA 108 (90 BASE) MCG/ACT IN AERS: 2.0000 | INHALATION_SPRAY | Freq: Four times a day (QID) | RESPIRATORY_TRACT | 0 refills | Status: DC | PRN

## 2017-04-10 MED ORDER — PRENATAL GUMMIES/DHA & FA 0.4-32.5 MG PO CHEW
1.0000 | CHEWABLE_TABLET | Freq: Every day | ORAL | 1 refills | Status: DC
Start: 2017-04-10 — End: 2018-11-16

## 2017-04-10 NOTE — Patient Instructions (Signed)

## 2017-04-10 NOTE — Progress Notes (Signed)
Name: Patricia Guerrero   MRN: [id number]    DOB: 12/03/1986   Date:04/10/2017       Progress Note  Subjective  Chief Complaint  Chief Complaint  Patient presents with  . Contraception    Would like to discuss starting Depo. But states she was suppose to start menstrual cycle last week and did not. Has been sexually active with no contraception.    HPI  Positive pregnancy test: she scheduled an appointment for March and missed the appointment , she came in today, and is pregnant. LMP 03/03/2017, GA today is 5 weeks and 3 days. EDD: 12/08/2017. She is not smoking, does not drink alcohol and is married, last OB at [facility] and that is where she wants to go. She denies breast tenderness, no urinary frequency, she has been feeling tired, however worked 7 days straight   Mild asthma Intermittent: no wheezing, cough, SOB, but she would like a refill of her inhaler.    Patient Active Problem List   Diagnosis Date Noted  . Genital herpes simplex type 2 04/10/2017  . Mild intermittent asthma without complication 04/10/2017    Past Surgical History:  Procedure Laterality Date  . NO PAST SURGERIES      Family History  Problem Relation Age of Onset  . Diabetes Mother   . Asthma Mother   . Hypertension Father   . Asthma Father   . Hypertension Sister     Social History   Social History  . Marital status: Married    Spouse name: Museum/gallery exhibitions officer  . Number of children: 3  . Years of education: N/A   Occupational History  . Warehouse manager   Social History Main Topics  . Smoking status: Never Smoker  . Smokeless tobacco: Never Used  . Alcohol use No  . Drug use: No  . Sexual activity: Yes    Partners: Male    Birth control/ protection: None   Other Topics Concern  . Not on file   Social History Narrative   Married has 3 children and pregnant.    Works full time as a Production designer, theatre/television/film at [employer].      Current Outpatient Prescriptions:  .  albuterol (PROVENTIL HFA;VENTOLIN HFA)  108 (90 Base) MCG/ACT inhaler, Inhale 2 puffs into the lungs every 6 (six) hours as needed for wheezing or shortness of breath., Disp: 1 Inhaler, Rfl: 0  No Known Allergies   ROS  Constitutional: Negative for fever or weight change.  Respiratory: Negative for cough and shortness of breath.   Cardiovascular: Negative for chest pain or palpitations.  Gastrointestinal: Negative for abdominal pain, no bowel changes.  Musculoskeletal: Negative for gait problem or joint swelling.  Skin: Negative for rash.  Neurological: Negative for dizziness or headache.  No other specific complaints in a complete review of systems (except as listed in HPI above).  Objective  Vitals:   04/10/17 1027  BP: 114/68  Pulse: (!) 108  Resp: 16  Temp: 98.2 F (36.8 C)  TempSrc: Oral  SpO2: 97%  Weight: 156 lb 11.2 oz (71.1 kg)  Height:  (1.753 m)    Body mass index is 23.14 kg/m.  Physical Exam  Constitutional: Patient appears well-developed and well-nourished.  No distress.  HEENT: head atraumatic, normocephalic, pupils equal and reactive to light,  neck supple, throat within normal limits Cardiovascular: Normal rate, regular rhythm and normal heart sounds.  No murmur heard. No BLE edema. Pulmonary/Chest: Effort normal and breath sounds normal. No respiratory distress.  Abdominal: Soft.  There is no tenderness. Psychiatric: Patient has a normal mood and affect. behavior is normal. Judgment and thought content normal.  Recent Results (from the past 2160 hour(s))  POCT urine pregnancy     Status: Abnormal   Collection Time: 04/10/17 10:41 AM  Result Value Ref Range   Preg Test, Ur Positive (A) Negative      PHQ2/9: Depression screen [facility] 2/9 04/10/2017 11/24/2015  Decreased Interest 0 0  Down, Depressed, Hopeless 0 0  PHQ - 2 Score 0 0     Fall Risk: Fall Risk  04/10/2017 11/24/2015  Falls in the past year? No No     Functional Status Survey: Is the patient deaf or have  difficulty hearing?: No Does the patient have difficulty seeing, even when wearing glasses/contacts?: No Does the patient have difficulty concentrating, remembering, or making decisions?: No Does the patient have difficulty walking or climbing stairs?: No Does the patient have difficulty dressing or bathing?: No Does the patient have difficulty doing errands alone such as visiting a doctor's office or shopping?: No    Assessment & Plan  1. Positive pregnancy test  POCT urine pregnancy - Ambulatory referral to Obstetrics / Gynecology  2. Mild intermittent asthma without complication  - albuterol (PROVENTIL HFA;VENTOLIN HFA) 108 (90 Base) MCG/ACT inhaler; Inhale 2 puffs into the lungs every 6 (six) hours as needed for wheezing or shortness of breath.  Dispense: 1 Inhaler; Refill: 0

## 2017-04-14 ENCOUNTER — Inpatient Hospital Stay (HOSPITAL_COMMUNITY): Payer: Medicaid Other

## 2017-04-14 ENCOUNTER — Inpatient Hospital Stay (HOSPITAL_COMMUNITY)
Admission: AD | Admit: 2017-04-14 | Discharge: 2017-04-14 | Disposition: A | Discharge status: Home / Self Care | Payer: Medicaid Other | Source: Ambulatory Visit | Attending: Obstetrics and Gynecology | Admitting: Obstetrics and Gynecology

## 2017-04-14 ENCOUNTER — Encounter (HOSPITAL_COMMUNITY): Payer: Self-pay

## 2017-04-14 DIAGNOSIS — J45909 Unspecified asthma, uncomplicated: Secondary | ICD-10-CM | POA: Insufficient documentation

## 2017-04-14 DIAGNOSIS — Z79899 Other long term (current) drug therapy: Secondary | ICD-10-CM | POA: Diagnosis not present

## 2017-04-14 DIAGNOSIS — O26891 Other specified pregnancy related conditions, first trimester: Secondary | ICD-10-CM

## 2017-04-14 DIAGNOSIS — O99512 Diseases of the respiratory system complicating pregnancy, second trimester: Secondary | ICD-10-CM | POA: Diagnosis not present

## 2017-04-14 DIAGNOSIS — Z3A01 Less than 8 weeks gestation of pregnancy: Secondary | ICD-10-CM | POA: Insufficient documentation

## 2017-04-14 DIAGNOSIS — R109 Unspecified abdominal pain: Secondary | ICD-10-CM

## 2017-04-14 DIAGNOSIS — N898 Other specified noninflammatory disorders of vagina: Secondary | ICD-10-CM

## 2017-04-14 DIAGNOSIS — O26892 Other specified pregnancy related conditions, second trimester: Secondary | ICD-10-CM | POA: Diagnosis not present

## 2017-04-14 DIAGNOSIS — O26899 Other specified pregnancy related conditions, unspecified trimester: Secondary | ICD-10-CM

## 2017-04-14 LAB — URINALYSIS, ROUTINE W REFLEX MICROSCOPIC
Bacteria, UA: NONE SEEN
Bilirubin Urine: NEGATIVE
GLUCOSE, UA: NEGATIVE mg/dL
Hgb urine dipstick: NEGATIVE
KETONES UR: NEGATIVE mg/dL
Leukocytes, UA: NEGATIVE
Nitrite: NEGATIVE
Protein, ur: 30 mg/dL — AB
SPECIFIC GRAVITY, URINE: 1.023 (ref 1.005–1.030)
pH: 8 (ref 5.0–8.0)

## 2017-04-14 LAB — CBC
HCT: 38.3 % (ref 36.0–46.0)
HEMOGLOBIN: 13.2 g/dL (ref 12.0–15.0)
MCH: 27.8 pg (ref 26.0–34.0)
MCHC: 34.5 g/dL (ref 30.0–36.0)
MCV: 80.6 fL (ref 78.0–100.0)
PLATELETS: 234 10*3/uL (ref 150–400)
RBC: 4.75 MIL/uL (ref 3.87–5.11)
RDW: 13.6 % (ref 11.5–15.5)
WBC: 6.2 10*3/uL (ref 4.0–10.5)

## 2017-04-14 LAB — HCG, QUANTITATIVE, PREGNANCY: hCG, Beta Chain, Quant, S: 31099 m[IU]/mL — ABNORMAL HIGH (ref <5)

## 2017-04-14 LAB — WET PREP, GENITAL
CLUE CELLS WET PREP: NONE SEEN
Sperm: NONE SEEN
Trich, Wet Prep: NONE SEEN
YEAST WET PREP: NONE SEEN

## 2017-04-14 NOTE — MAU Note (Signed)
Patient presents with positive UPT, has been having cramping, none at presents, vaginal discharge with an odor, not sure how far along she is and does not have an OB

## 2017-04-14 NOTE — MAU Provider Note (Signed)
History     CSN: [id number]  Arrival date and time: 04/14/17 5621   First Provider Initiated Contact with Patient 04/14/17 [phone]      Chief Complaint  Patient presents with  . Abdominal Cramping   [id number] @[redacted]w[redacted]d  by sure LMP here for cramping. She had one episode of cramping yesterday. She did not take anything for it. No VB. She reports malodorous white vaginal discharge x2 weeks. No new partner in 1 year. Of note she is seeing a dentist for a tooth abscess and planning extraction in the next week since completing abx.     OB History    Gravida Para Term Preterm AB Living   5 3 3  0 1 3   SAB TAB Ectopic Multiple Live Births   0 1 0 0        Past Medical History:  Diagnosis Date  . Asthma     Past Surgical History:  Procedure Laterality Date  . NO PAST SURGERIES      Family History  Problem Relation Age of Onset  . Diabetes Mother   . Asthma Mother   . Hypertension Father   . Asthma Father   . Hypertension Sister     Social History  Substance Use Topics  . Smoking status: Never Smoker  . Smokeless tobacco: Never Used  . Alcohol use No    Allergies: No Known Allergies  Prescriptions Prior to Admission  Medication Sig Dispense Refill Last Dose  . albuterol (PROVENTIL HFA;VENTOLIN HFA) 108 (90 Base) MCG/ACT inhaler Inhale 2 puffs into the lungs every 6 (six) hours as needed for wheezing or shortness of breath. 1 Inhaler 0   . Prenatal MV-Min-FA-Omega-3 (PRENATAL GUMMIES/DHA & FA) 0.4-32.5 MG CHEW Chew 1 tablet by mouth daily. 30 tablet 1     Review of Systems  Constitutional: Negative for chills and fever.  Gastrointestinal: Positive for abdominal pain.  Genitourinary: Positive for vaginal discharge. Negative for dysuria, frequency, hematuria, urgency and vaginal bleeding.   Physical Exam   Blood pressure 122/78, pulse 96, temperature 98.3 F (36.8 C), temperature source Oral, resp. rate 16, height 5\' 8"  (1.727 m), weight 69.9 kg (154 lb 0.6 oz), last  menstrual period 03/03/2017.  Physical Exam  Constitutional: She is oriented to person, place, and time. She appears well-developed and well-nourished. No distress.  HENT:  Head: Normocephalic and atraumatic.  Neck: Normal range of motion.  Respiratory: Effort normal.  GI: Soft. She exhibits no distension and no mass. There is no tenderness. There is no rebound and no guarding.  Genitourinary:  Genitourinary Comments: External: no lesions or erythema Vagina: rugated, parous/ nulli, thin white discharge Uterus: non enlarged, anteverted, non tender, no CMT Adnexae: no masses, no tenderness left, no tenderness right   Musculoskeletal: Normal range of motion.  Neurological: She is alert and oriented to person, place, and time.  Skin: Skin is warm and dry.   Results for orders placed or performed during the hospital encounter of 04/14/17 (from the past 24 hour(s))  Urinalysis, Routine w reflex microscopic     Status: Abnormal   Collection Time: 04/14/17  9:45 AM  Result Value Ref Range   Color, Urine YELLOW YELLOW   APPearance CLEAR CLEAR   Specific Gravity, Urine 1.023 1.005 - 1.030   pH 8.0 5.0 - 8.0   Glucose, UA NEGATIVE NEGATIVE mg/dL   Hgb urine dipstick NEGATIVE NEGATIVE   Bilirubin Urine NEGATIVE NEGATIVE   Ketones, ur NEGATIVE NEGATIVE mg/dL   Protein, ur 30 (  A) NEGATIVE mg/dL   Nitrite NEGATIVE NEGATIVE   Leukocytes, UA NEGATIVE NEGATIVE   RBC / HPF 0-5 0 - 5 RBC/hpf   WBC, UA 0-5 0 - 5 WBC/hpf   Bacteria, UA NONE SEEN NONE SEEN   Squamous Epithelial / LPF 0-5 (A) NONE SEEN   Mucous PRESENT   Wet prep, genital     Status: Abnormal   Collection Time: 04/14/17 10:00 AM  Result Value Ref Range   Yeast Wet Prep HPF POC NONE SEEN NONE SEEN   Trich, Wet Prep NONE SEEN NONE SEEN   Clue Cells Wet Prep HPF POC NONE SEEN NONE SEEN   WBC, Wet Prep HPF POC MANY (A) NONE SEEN   Sperm NONE SEEN   CBC     Status: None   Collection Time: 04/14/17 10:02 AM  Result Value Ref Range    WBC 6.2 4.0 - 10.5 K/uL   RBC 4.75 3.87 - 5.11 MIL/uL   Hemoglobin 13.2 12.0 - 15.0 g/dL   HCT 84.638.3 96.236.0 - 95.246.0 %   MCV 80.6 78.0 - 100.0 fL   MCH 27.8 26.0 - 34.0 pg   MCHC 34.5 30.0 - 36.0 g/dL   RDW 84.113.6 32.411.5 - 40.115.5 %   Platelets 234 150 - 400 K/uL  hCG, quantitative, pregnancy     Status: Abnormal   Collection Time: 04/14/17 10:02 AM  Result Value Ref Range   hCG, Beta Chain, Quant, S 31,099 (H) <5 mIU/mL   [place] Ob Comp Less 14 Wks  Result Date: 04/14/2017 CLINICAL DATA:  Cramping in early pregnancy EXAM: OBSTETRIC <14 WK US AND TRANSVAGINAL OB US TECHNIQUE: Both transabdominal and transvaginal ultrasound examinations were performed for complete evaluation of the gestation as well as the maternal uterus, adnexal regions, and pelvic cul-de-sac. Transvaginal technique was performed to assess early pregnancy. COMPARISON:  None. FINDINGS: Intrauterine gestational sac: Single Yolk sac:  Visualized. Embryo:  Visualized. Cardiac Activity: Visualized. Heart Rate: 112  bpm CRL:  4.4  mm   6 w   1 d                  US EDC: 12/07/2017 Subchorionic hemorrhage:  None visualized. Maternal uterus/adnexae: Bilateral ovaries are within normal limits, noting a right corpus luteal cyst. No free fluid. IMPRESSION: Single live intrauterine gestation, with estimated gestational age [redacted] weeks 1 day by crown-rump length. Electronically Signed   By: Charline BillsSriyesh  Krishnan M.D.   On: 04/14/2017 11:15   [place] Ob Transvaginal  Result Date: 04/14/2017 CLINICAL DATA:  Cramping in early pregnancy EXAM: OBSTETRIC <14 WK US AND TRANSVAGINAL OB US TECHNIQUE: Both transabdominal and transvaginal ultrasound examinations were performed for complete evaluation of the gestation as well as the maternal uterus, adnexal regions, and pelvic cul-de-sac. Transvaginal technique was performed to assess early pregnancy. COMPARISON:  None. FINDINGS: Intrauterine gestational sac: Single Yolk sac:  Visualized. Embryo:  Visualized. Cardiac Activity:  Visualized. Heart Rate: 112  bpm CRL:  4.4  mm   6 w   1 d                  US EDC: 12/07/2017 Subchorionic hemorrhage:  None visualized. Maternal uterus/adnexae: Bilateral ovaries are within normal limits, noting a right corpus luteal cyst. No free fluid. IMPRESSION: Single live intrauterine gestation, with estimated gestational age [redacted] weeks 1 day by crown-rump length. Electronically Signed   By: Charline BillsSriyesh  Krishnan M.D.   On: 04/14/2017 11:15   MAU Course  Procedures  MDM Labs  and [place] ordered and reviewed. No evidence of vaginal infection or UTI. Cramping likely physiologic to pregnancy. GC/CT pending. Normal live IUP on [place] consistent with LMP dating. Stable for discharge home.  Assessment and Plan   1. [redacted] weeks gestation of pregnancy   2. Abdominal cramping affecting pregnancy    Discharge home Follow up with OB provider of choice-list provided Follow up with dentist as scheduled-dental letter provided Return for OB emergencies  Allergies as of 04/14/2017   No Known Allergies     Medication List    TAKE these medications   albuterol 108 (90 Base) MCG/ACT inhaler Commonly known as:  PROVENTIL HFA;VENTOLIN HFA Inhale 2 puffs into the lungs every 6 (six) hours as needed for wheezing or shortness of breath.   PRENATAL GUMMIES/DHA & FA 0.4-32.5 MG Chew Chew 1 tablet by mouth daily.      Donette Larry, CNM 04/14/2017, 10:18 AM

## 2017-04-14 NOTE — Discharge Instructions (Signed)
Abdominal Pain During Pregnancy °Belly (abdominal) pain is common during pregnancy. Most of the time, it is not a serious problem. Other times, it can be a sign that something is wrong with the pregnancy. Always tell your doctor if you have belly pain. °Follow these instructions at home: °Monitor your belly pain for any changes. The following actions may help you feel better: °· Do not have sex (intercourse) or put anything in your vagina until you feel better. °· Rest until your pain stops. °· Drink clear fluids if you feel sick to your stomach (nauseous). Do not eat solid food until you feel better. °· Only take medicine as told by your doctor. °· Keep all doctor visits as told. °Get help right away if: °· You are bleeding, leaking fluid, or pieces of tissue come out of your vagina. °· You have more pain or cramping. °· You keep throwing up (vomiting). °· You have pain when you pee (urinate) or have blood in your pee. °· You have a fever. °· You do not feel your baby moving as much. °· You feel very weak or feel like passing out. °· You have trouble breathing, with or without belly pain. °· You have a very bad headache and belly pain. °· You have fluid leaking from your vagina and belly pain. °· You keep having watery poop (diarrhea). °· Your belly pain does not go away after resting, or the pain gets worse. °This information is not intended to replace advice given to you by your health care provider. Make sure you discuss any questions you have with your health care provider. °Document Released: 11/16/2009 Document Revised: 07/06/2016 Document Reviewed: 06/27/2013 °Elsevier Interactive Patient Education © 2017 [employer] Inc[employer] ° °

## 2017-04-17 LAB — GC/CHLAMYDIA PROBE AMP (~~LOC~~) NOT AT ARMC
Chlamydia: NEGATIVE
NEISSERIA GONORRHEA: NEGATIVE

## 2017-05-08 DIAGNOSIS — Z3009 Encounter for other general counseling and advice on contraception: Secondary | ICD-10-CM | POA: Insufficient documentation

## 2017-05-16 LAB — HM PAP SMEAR: HM Pap smear: POSITIVE

## 2017-10-15 ENCOUNTER — Inpatient Hospital Stay (HOSPITAL_COMMUNITY)
Admission: AD | Admit: 2017-10-15 | Discharge: 2017-10-15 | Disposition: A | Discharge status: Home / Self Care | Payer: Medicaid Other | Source: Ambulatory Visit | Attending: Family Medicine | Admitting: Family Medicine

## 2017-10-15 ENCOUNTER — Encounter (HOSPITAL_COMMUNITY): Payer: Self-pay

## 2017-10-15 DIAGNOSIS — Z8249 Family history of ischemic heart disease and other diseases of the circulatory system: Secondary | ICD-10-CM | POA: Diagnosis not present

## 2017-10-15 DIAGNOSIS — O99513 Diseases of the respiratory system complicating pregnancy, third trimester: Secondary | ICD-10-CM | POA: Insufficient documentation

## 2017-10-15 DIAGNOSIS — Z825 Family history of asthma and other chronic lower respiratory diseases: Secondary | ICD-10-CM | POA: Insufficient documentation

## 2017-10-15 DIAGNOSIS — J45909 Unspecified asthma, uncomplicated: Secondary | ICD-10-CM | POA: Diagnosis not present

## 2017-10-15 DIAGNOSIS — Z833 Family history of diabetes mellitus: Secondary | ICD-10-CM | POA: Insufficient documentation

## 2017-10-15 DIAGNOSIS — O479 False labor, unspecified: Secondary | ICD-10-CM | POA: Diagnosis not present

## 2017-10-15 DIAGNOSIS — Z3A32 32 weeks gestation of pregnancy: Secondary | ICD-10-CM | POA: Diagnosis not present

## 2017-10-15 DIAGNOSIS — O4703 False labor before 37 completed weeks of gestation, third trimester: Secondary | ICD-10-CM | POA: Diagnosis not present

## 2017-10-15 DIAGNOSIS — O4700 False labor before 37 completed weeks of gestation, unspecified trimester: Secondary | ICD-10-CM

## 2017-10-15 DIAGNOSIS — Z79899 Other long term (current) drug therapy: Secondary | ICD-10-CM | POA: Insufficient documentation

## 2017-10-15 LAB — URINALYSIS, ROUTINE W REFLEX MICROSCOPIC
Bilirubin Urine: NEGATIVE
Glucose, UA: NEGATIVE mg/dL
Hgb urine dipstick: NEGATIVE
Ketones, ur: NEGATIVE mg/dL
Nitrite: NEGATIVE
PROTEIN: 30 mg/dL — AB
Specific Gravity, Urine: 1.024 (ref 1.005–1.030)
pH: 6 (ref 5.0–8.0)

## 2017-10-15 MED ORDER — LACTATED RINGERS IV BOLUS (SEPSIS)
1000.0000 mL | Freq: Once | INTRAVENOUS | Status: AC
  Administered 2017-10-15: 1000 mL via INTRAVENOUS

## 2017-10-15 MED ORDER — NIFEDIPINE 10 MG PO CAPS
10.0000 mg | ORAL_CAPSULE | ORAL | Status: AC
  Administered 2017-10-15 (×3): 10 mg via ORAL
  Filled 2017-10-15 (×3): qty 1

## 2017-10-15 NOTE — MAU Provider Note (Signed)
History     CSN: [id number] Arrival date and time: 10/15/17 0920  None     Chief Complaint  Patient presents with  . Contractions    HPI: Patricia Guerrero is a 31 y.o. [phone] with IUP at 8926w3d who presents to maternity admissions reporting with c/c of contractions. Reports feeling contractions every 4 minutes this morning, seemed to have slowed down some, but now feeling them every 2-4 min. Denies any vaginal bleeding or leakage of fluid. Reports good fetal movement. Endorses poor water intake. Last sexual intercourse about 4 days ago.   Also denies any abnormal vaginal discharge, fevers, chills, urinary frequency, dysuria, nausea, vomiting, other GI or GU symptoms or other general symptoms.  [facility] [place]. Pregnancy complicated by hx of asthma, hx of genital HSV, and low maternal weight gain.  Past obstetric history: OB History  Gravida Para Term Preterm AB Living  5 3 3  0 1 3  SAB TAB Ectopic Multiple Live Births  0 1 0 0      # Outcome Date GA Lbr Len/2nd Weight Sex Delivery Anes PTL Lv  5 Current           4 TAB           3 Term      Vag-Spont     2 Term      Vag-Spont     1 Term      Vag-Spont         Past Medical History:  Diagnosis Date  . Asthma     Past Surgical History:  Procedure Laterality Date  . NO PAST SURGERIES      Family History  Problem Relation Age of Onset  . Diabetes Mother   . Asthma Mother   . Hypertension Father   . Asthma Father   . Hypertension Sister     Social History   Tobacco Use  . Smoking status: Never Smoker  . Smokeless tobacco: Never Used  Substance Use Topics  . Alcohol use: No  . Drug use: No    Allergies: No Known Allergies  Medications Prior to Admission  Medication Sig Dispense Refill Last Dose  . albuterol (PROVENTIL HFA;VENTOLIN HFA) 108 (90 Base) MCG/ACT inhaler Inhale 2 puffs into the lungs every 6 (six) hours as needed for wheezing or shortness of breath. 1 Inhaler 0  prn  . Prenatal MV-Min-FA-Omega-3 (PRENATAL GUMMIES/DHA & FA) 0.4-32.5 MG CHEW Chew 1 tablet by mouth daily. 30 tablet 1 04/13/2017 at Unknown time    Review of Systems - Negative except for what is mentioned in HPI.  Physical Exam   Blood pressure 108/69, pulse 91, temperature 98 F (36.7 C), temperature source Oral, resp. rate 16, last menstrual period 03/03/2017.  Constitutional: Well-developed, well-nourished female in no acute distress. Seems to have slight discomfort with contractions.  HENT: [place]/AT, normal oropharynx mucosa. MMM Eyes: normal conjunctivae, no scleral icterus Cardiovascular: normal rate Respiratory: normal effort GI: Abd soft, non-tender, gravid appropriate for gestational age.   SVE: FT/thick/high MSK: Extremities nontender, no edema, normal ROM Neurologic: Alert and oriented x 4. Psych: Normal mood and affect Skin: warm and dry   FHT:  Baseline 150 , moderate variability, accelerations present, no decelerations Toco: Contractions: q 3-4 mins  MAU Course/MDM  Procedures  Pt seen and examined. Ctx q3-4 min. Reactive NST FFN collected prior to SVE. SVE FT/thick, reassuring, FFN not sent. IV fluids given  Persistent contractions. Procardia x 3 given  with improvement in contractions. Repeat SVE unchanged.   Assessment and Plan  Assessment: 1. Preterm contractions     Plan: --Discharge home in stable condition.  --PTL precautions --Advised to increased water intake to at least 80oz/day   Rilyn Scroggs, Kandra Nicolas, MD 10/15/2017 10:56 AM

## 2017-10-15 NOTE — MAU Note (Signed)
Was having contractions every 4 min or less this morning. Now that she is in her room they have slowed down. Reports having several back to back and then they stop for a little while. No vag bleeding, no LOF. +FM

## 2017-10-15 NOTE — Discharge Instructions (Signed)

## 2018-03-01 ENCOUNTER — Encounter (HOSPITAL_COMMUNITY): Payer: Self-pay

## 2018-08-07 ENCOUNTER — Ambulatory Visit: Payer: Self-pay | Admitting: Nurse Practitioner

## 2018-08-22 ENCOUNTER — Other Ambulatory Visit (HOSPITAL_COMMUNITY)
Admission: RE | Admit: 2018-08-22 | Discharge: 2018-08-22 | Disposition: A | Discharge status: Home / Self Care | Payer: Self-pay | Source: Ambulatory Visit | Attending: Family Medicine | Admitting: Family Medicine

## 2018-08-22 ENCOUNTER — Ambulatory Visit: Payer: Self-pay | Admitting: Family Medicine

## 2018-08-22 ENCOUNTER — Encounter: Payer: Self-pay | Admitting: Family Medicine

## 2018-08-22 VITALS — BP 120/78 | HR 80 | Temp 98.5°F | Resp 18 | Ht 68.0 in | Wt 175.6 lb

## 2018-08-22 DIAGNOSIS — N898 Other specified noninflammatory disorders of vagina: Secondary | ICD-10-CM

## 2018-08-22 DIAGNOSIS — N3001 Acute cystitis with hematuria: Secondary | ICD-10-CM

## 2018-08-22 DIAGNOSIS — Z975 Presence of (intrauterine) contraceptive device: Secondary | ICD-10-CM

## 2018-08-22 DIAGNOSIS — Z113 Encounter for screening for infections with a predominantly sexual mode of transmission: Secondary | ICD-10-CM

## 2018-08-22 DIAGNOSIS — Z23 Encounter for immunization: Secondary | ICD-10-CM

## 2018-08-22 HISTORY — DX: Presence of (intrauterine) contraceptive device: Z97.5

## 2018-08-22 LAB — POCT URINALYSIS DIPSTICK
BILIRUBIN UA: NEGATIVE
GLUCOSE UA: NEGATIVE
KETONES UA: NEGATIVE
Nitrite, UA: POSITIVE
Protein, UA: NEGATIVE
Spec Grav, UA: 1.01 (ref 1.010–1.025)
UROBILINOGEN UA: NEGATIVE U/dL — AB
pH, UA: 7 (ref 5.0–8.0)

## 2018-08-22 MED ORDER — SULFAMETHOXAZOLE-TRIMETHOPRIM 800-160 MG PO TABS: 1.0000 | ORAL_TABLET | Freq: Two times a day (BID) | ORAL | 0 refills | Status: AC

## 2018-08-22 NOTE — Progress Notes (Signed)
Name: Patricia Guerrero   MRN: [id number]    DOB: 03-19-1986   Date:08/22/2018       Progress Note  Subjective  Chief Complaint  Chief Complaint  Patient presents with  . Vaginal Discharge    and odor for 2 weeks    HPI  Pt presents with concern for abnormal vaginal odor, she notes increase in vaginal discharge. Denies history of kidney stones, denies abdominal pain, dysuria, frank hematuria, nausea, vomiting, fevers, or chills.  She notes some intermittent spotting last week - she has mirena in place since January 2019.  She has had 1 sexual partner in the last year.  Needs flu shot - we will give today.  Patient Active Problem List   Diagnosis Date Noted  . IUD (intrauterine device) in place 08/22/2018  . General counseling and advice on contraceptive management 05/08/2017  . Genital herpes simplex type 2 04/10/2017  . Mild intermittent asthma without complication 04/10/2017    Social History   Tobacco Use  . Smoking status: Never Smoker  . Smokeless tobacco: Never Used  Substance Use Topics  . Alcohol use: No     Current Outpatient Medications:  .  albuterol (PROVENTIL HFA;VENTOLIN HFA) 108 (90 Base) MCG/ACT inhaler, Inhale 2 puffs into the lungs every 6 (six) hours as needed for wheezing or shortness of breath., Disp: 1 Inhaler, Rfl: 0 .  Prenatal MV-Min-FA-Omega-3 (PRENATAL GUMMIES/DHA & FA) 0.4-32.5 MG CHEW, Chew 1 tablet by mouth daily. (Patient not taking: Reported on 10/15/2017), Disp: 30 tablet, Rfl: 1 .  sulfamethoxazole-trimethoprim (BACTRIM DS,SEPTRA DS) 800-160 MG tablet, Take 1 tablet by mouth 2 (two) times daily for 3 days., Disp: 6 tablet, Rfl: 0  No Known Allergies  ROS  Ten systems reviewed and is negative except as mentioned in HPI  Objective  Vitals:   08/22/18 0958  BP: 120/78  Pulse: 80  Resp: 18  Temp: 98.5 F (36.9 C)  TempSrc: Oral  SpO2: 97%  Weight: 175 lb 9.6 oz (79.7 kg)  Height: 5\' 8"  (1.727 m)   Body mass index is 26.7  kg/m.  Nursing Note and Vital Signs reviewed.  Physical Exam  Constitutional: Patient appears well-developed and well-nourished. No distress.  HENT: Head: Normocephalic and atraumatic. Neck: Normal range of motion. Neck supple. No JVD present. No thyromegaly present.  Cardiovascular: Normal rate, regular rhythm and normal heart sounds.  No murmur heard. No BLE edema. Pulmonary/Chest: Effort normal and breath sounds normal. No respiratory distress. Abdominal: Soft. Bowel sounds are normal, no distension. There is no tenderness. no masses. No CVA tenderness Skin: Skin is warm and dry. No rash noted. No erythema.  Psychiatric: Patient has a normal mood and affect. behavior is normal. Judgment and thought content normal.  Results for orders placed or performed in visit on 08/22/18 (from the past 72 hour(s))  POCT urinalysis dipstick     Status: Abnormal   Collection Time: 08/22/18 10:14 AM  Result Value Ref Range   Color, UA yellow    Clarity, UA clear    Glucose, UA Negative Negative   Bilirubin, UA negative    Ketones, UA negative    Spec Grav, UA 1.010 1.010 - 1.025   Blood, UA none    pH, UA 7.0 5.0 - 8.0   Protein, UA Negative Negative   Urobilinogen, UA negative (A) 0.2 or 1.0 E.U./dL   Nitrite, UA positive    Leukocytes, UA Large (3+) (A) Negative   Appearance clear    Odor  none     Assessment & Plan  1. Acute cystitis with hematuria - sulfamethoxazole-trimethoprim (BACTRIM DS,SEPTRA DS) 800-160 MG tablet; Take 1 tablet by mouth 2 (two) times daily for 3 days.  Dispense: 6 tablet; Refill: 0 - Urine Culture  2. Foul smelling vaginal discharge - POCT urinalysis dipstick - Cervicovaginal ancillary only  3. IUD (intrauterine device) in place  4. Needs flu shot - Flu Vaccine QUAD 6+ mos PF IM (Fluarix Quad PF)  5. Routine screening for STI (sexually transmitted infection) - Cervicovaginal ancillary only - HIV antibody - RPR  - Pt not breastfeeding - will proceed  with abx treatment based on guidelines as indicated,  -Red flags and when to present for emergency care or RTC including fever >101.44F, chest pain, shortness of breath, new/worsening/un-resolving symptoms, severe abdominal/flank/back pain, reviewed with patient at time of visit. Follow up and care instructions discussed and provided in AVS.

## 2018-08-23 LAB — CERVICOVAGINAL ANCILLARY ONLY
Bacterial vaginitis: NEGATIVE
Candida vaginitis: NEGATIVE
Chlamydia: NEGATIVE
Neisseria Gonorrhea: NEGATIVE
Trichomonas: NEGATIVE

## 2018-08-23 LAB — URINE CULTURE
MICRO NUMBER:: 91087974
SPECIMEN QUALITY:: ADEQUATE

## 2018-08-23 LAB — HIV ANTIBODY (ROUTINE TESTING W REFLEX): HIV 1&2 Ab, 4th Generation: NONREACTIVE

## 2018-08-23 LAB — RPR: RPR: NONREACTIVE

## 2018-11-16 ENCOUNTER — Ambulatory Visit (INDEPENDENT_AMBULATORY_CARE_PROVIDER_SITE_OTHER): Payer: Self-pay | Admitting: Nurse Practitioner

## 2018-11-16 ENCOUNTER — Other Ambulatory Visit (HOSPITAL_COMMUNITY)
Admission: RE | Admit: 2018-11-16 | Discharge: 2018-11-16 | Disposition: A | Discharge status: Home / Self Care | Payer: Self-pay | Source: Ambulatory Visit | Attending: Nurse Practitioner | Admitting: Nurse Practitioner

## 2018-11-16 ENCOUNTER — Encounter: Payer: Self-pay | Admitting: Nurse Practitioner

## 2018-11-16 VITALS — BP 116/68 | HR 96 | Temp 98.9°F | Resp 16 | Ht 68.0 in | Wt 175.7 lb

## 2018-11-16 DIAGNOSIS — N898 Other specified noninflammatory disorders of vagina: Secondary | ICD-10-CM

## 2018-11-16 DIAGNOSIS — B309 Viral conjunctivitis, unspecified: Secondary | ICD-10-CM

## 2018-11-16 NOTE — Progress Notes (Signed)
Name: Patricia Guerrero   MRN: [id number]    DOB: 03/30/1986   Date:11/16/2018       Progress Note  Subjective  Chief Complaint  Chief Complaint  Patient presents with  . Vaginal Discharge    Onset-1 week, Light Odor and was tested last visit and everything back normal. Discharge cloudy white. Has tried Refresh and did not help.     HPI  Patient states started having mild foul- fishy vaginal odor and cloudy white discharge started a week ago. Has had similar episodes in the past- states once was treated with metronidazole- last had these symptoms in September- and took left over metronidazole for 2.5 days and symptoms resolved.   Patient monogamous relationship unprotected sex.  No abdominal cramping or unusual break through bleeding.   Patient Active Problem List   Diagnosis Date Noted  . IUD (intrauterine device) in place 08/22/2018  . General counseling and advice on contraceptive management 05/08/2017  . Genital herpes simplex type 2 04/10/2017  . Mild intermittent asthma without complication 04/10/2017    Past Medical History:  Diagnosis Date  . Asthma     Past Surgical History:  Procedure Laterality Date  . NO PAST SURGERIES      Social History   Tobacco Use  . Smoking status: Never Smoker  . Smokeless tobacco: Never Used  Substance Use Topics  . Alcohol use: No     Current Outpatient Medications:  .  albuterol (PROVENTIL HFA;VENTOLIN HFA) 108 (90 Base) MCG/ACT inhaler, Inhale 2 puffs into the lungs every 6 (six) hours as needed for wheezing or shortness of breath., Disp: 1 Inhaler, Rfl: 0  No Known Allergies  ROS   No other specific complaints in a complete review of systems (except as listed in HPI above).  Objective  Vitals:   11/16/18 1426  BP: 116/68  Pulse: 96  Resp: 16  Temp: 98.9 F (37.2 C)  TempSrc: Oral  SpO2: 96%  Weight: 175 lb 11.2 oz (79.7 kg)  Height: 5\' 8"  (1.727 m)    Body mass index is 26.72 kg/m.  Nursing Note and  Vital Signs reviewed.  Physical Exam  Constitutional: She appears well-developed and well-nourished.  HENT:  Head: Normocephalic and atraumatic.  Eyes: Left eye exhibits no chemosis, no discharge, no exudate and no hordeolum. No foreign body present in the left eye. Right conjunctiva is not injected. Right conjunctiva has no hemorrhage. Left conjunctiva is injected. Left conjunctiva has no hemorrhage.  Cardiovascular: Normal rate.  Pulmonary/Chest: Effort normal.  Genitourinary: Pelvic exam was performed with patient prone. No erythema, tenderness or bleeding in the vagina. No foreign body in the vagina. Vaginal discharge (yellow-white thin discharge) found.  Skin: Skin is warm and dry. No erythema.  Psychiatric: She has a normal mood and affect. Her behavior is normal.    No results found for this or any previous visit (from the past 48 hour(s)).  Assessment & Plan  1. Vaginal discharge Discussed importance of partner being checked if reoccurant - Cervicovaginal ancillary only  2. Acute viral conjunctivitis of left eye Discussed OTC treatment, call if unimproved in one week, go to eye doctor with visual changes

## 2018-11-16 NOTE — Patient Instructions (Addendum)
-   Do not wear contacts for a week, use your glasses, use lubricating eye drops do not touch eyes; if symptoms do not improve in one week call and let us know.  - If getting frequent vaginal infections ensure partner gets tested and treated - Recommend taking probiotics; either the pill over the counter or in diet through yogurt or kombucha ect.  -If cost if an issue for healthcare look up: [facility] [address] e  [phone]   Vaginitis Vaginitis is an inflammation of the vagina. It can happen when the normal bacteria and yeast in the vagina grow too much. There are different types. Treatment will depend on the type you have. Follow these instructions at home:  Take all medicines as told by your doctor.  Keep your vagina area clean and dry. Avoid soap. Rinse the area with water.  Avoid washing and cleaning out the vagina (douching).  Do not use tampons or have sex (intercourse) until your treatment is done.  Wipe from front to back after going to the restroom.  Wear cotton underwear.  Avoid wearing underwear while you sleep until your vaginitis is gone.  Avoid tight pants. Avoid underwear or nylons without a cotton panel.  Take off wet clothing (such as a bathing suit) as soon as you can.  Use mild, unscented products. Avoid fabric softeners and scented: ? Feminine sprays. ? Laundry detergents. ? Tampons. ? Soaps or bubble baths.  Practice safe sex and use condoms. Get help right away if:  You have belly (abdominal) pain.  You have a fever or lasting symptoms for more than 2-3 days.  You have a fever and your symptoms suddenly get worse. This information is not intended to replace advice given to you by your health care provider. Make sure you discuss any questions you have with your health care provider. Document Released: 02/24/2009 Document Revised: 05/05/2016 Document Reviewed: 05/10/2012 Elsevier Interactive Patient Education  2017  [employer]

## 2018-11-19 ENCOUNTER — Telehealth: Payer: Self-pay

## 2018-11-19 NOTE — Telephone Encounter (Signed)
Copied from CRM [phone]. Topic: General - Inquiry >> Nov 19, 2018 11:23 AM Windy KalataMichael, Taylor L, NT wrote: Reason for CRM: patient would like to know if her results came in from 11/16/18 visit

## 2018-11-20 ENCOUNTER — Other Ambulatory Visit: Payer: Self-pay | Admitting: Nurse Practitioner

## 2018-11-20 LAB — CERVICOVAGINAL ANCILLARY ONLY
BACTERIAL VAGINITIS: POSITIVE — AB
Candida vaginitis: NEGATIVE
Chlamydia: NEGATIVE
Neisseria Gonorrhea: NEGATIVE
TRICH (WINDOWPATH): NEGATIVE

## 2018-11-20 MED ORDER — METRONIDAZOLE 500 MG PO TABS: 500.0000 mg | ORAL_TABLET | Freq: Two times a day (BID) | ORAL | 0 refills | Status: DC

## 2018-11-20 NOTE — Telephone Encounter (Signed)
I asked Tiffany about this but she never responded.

## 2018-11-20 NOTE — Telephone Encounter (Signed)
Can we call lab to ask about this?

## 2018-11-20 NOTE — Telephone Encounter (Signed)
Please call patient- appears to be delay in results, because she has had BV previously and had some resolution with old rx willing to treat her for this at this time; please return 6 weeks after completion of medication for nurse only visit for wet-prep self swab, or sooner if symptoms do not resolve. Please take this medicine with food and complete the entire course. Do not drink any alcohol while on this medication and for 48 hours afterwards.

## 2018-11-20 NOTE — Telephone Encounter (Addendum)
Patient has been scheduled for 12/25/17 @ 8:30am.  Patient was informed of Elizabeth's message and was scheduled for 12/25/18 at 8:30am for a nurse visit.  Patient was also informed that as soon as the results come in and have been reviewed by her provider, someone will notify her. She said ok and thanks.

## 2018-12-25 ENCOUNTER — Ambulatory Visit: Payer: Self-pay

## 2018-12-26 ENCOUNTER — Other Ambulatory Visit (HOSPITAL_COMMUNITY)
Admission: RE | Admit: 2018-12-26 | Discharge: 2018-12-26 | Disposition: A | Discharge status: Home / Self Care | Payer: Self-pay | Source: Ambulatory Visit | Attending: Family Medicine | Admitting: Family Medicine

## 2018-12-26 ENCOUNTER — Ambulatory Visit: Payer: Self-pay

## 2018-12-26 DIAGNOSIS — N898 Other specified noninflammatory disorders of vagina: Secondary | ICD-10-CM | POA: Insufficient documentation

## 2018-12-27 LAB — CERVICOVAGINAL ANCILLARY ONLY
Bacterial vaginitis: NEGATIVE
Candida vaginitis: NEGATIVE

## 2019-01-08 IMAGING — US US OB TRANSVAGINAL
1 series · 15 of 28 positions shown · non-contrast
Comparison: None.

CLINICAL DATA: Cramping in early pregnancy

EXAM:
OBSTETRIC <14 WK US AND TRANSVAGINAL OB US
TECHNIQUE: Both transabdominal and transvaginal ultrasound examinations were
performed for complete evaluation of the gestation as well as the
maternal uterus, adnexal regions, and pelvic cul-de-sac.
Transvaginal technique was performed to assess early pregnancy.

[Series 1: us ob transvaginal · 15 of 78 slices shown]
[im 1/78]
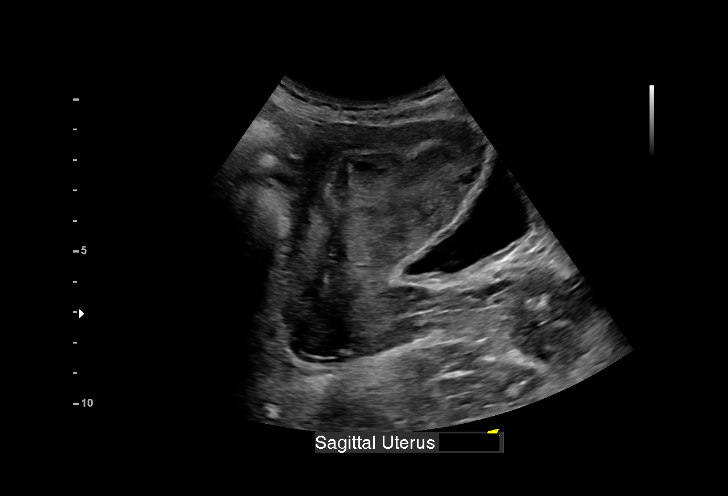
[im 6/78]
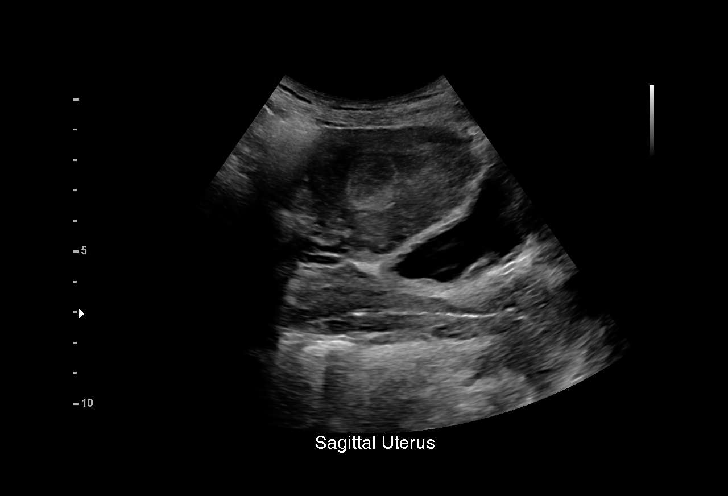
[im 12/78]
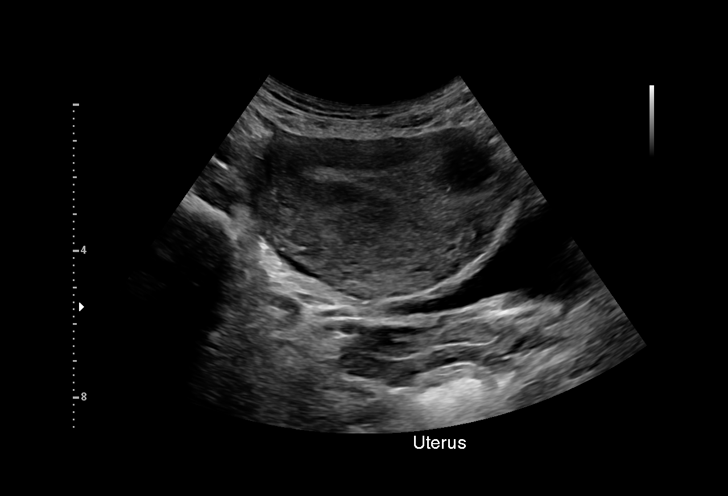
[im 18/78]
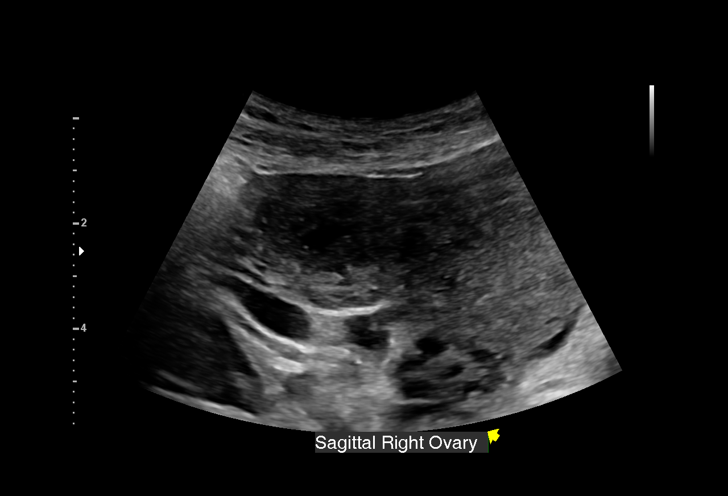
[im 23/78]
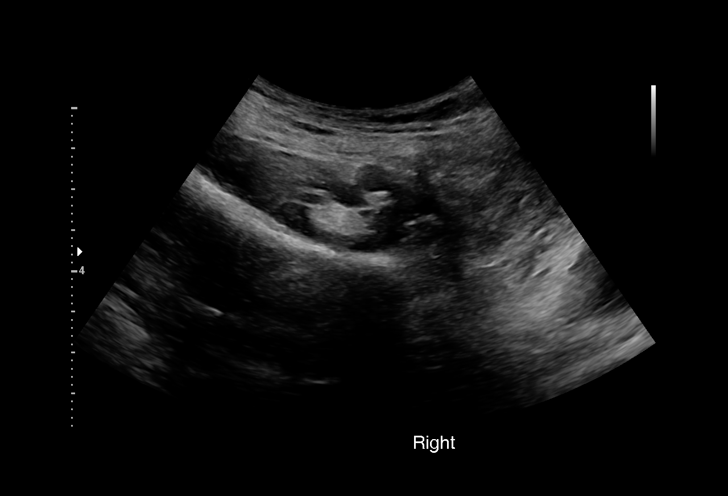
[im 29/78]
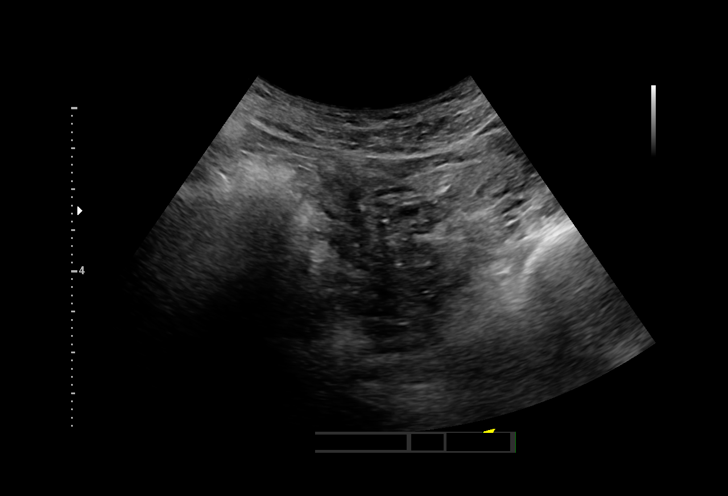
[im 35/78]
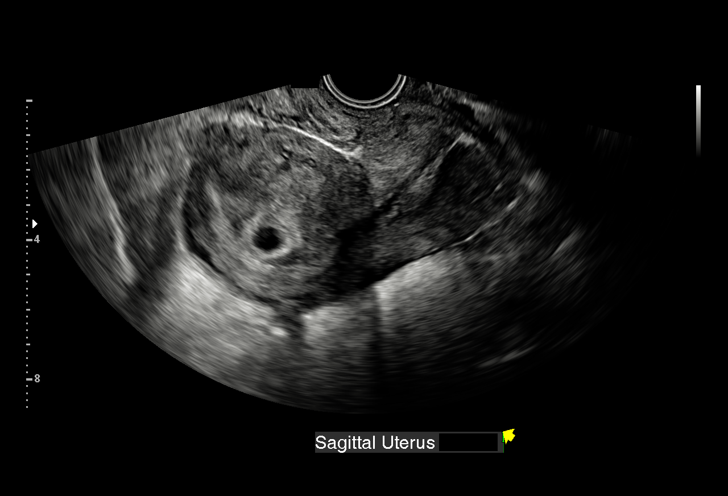
[im 40/78]
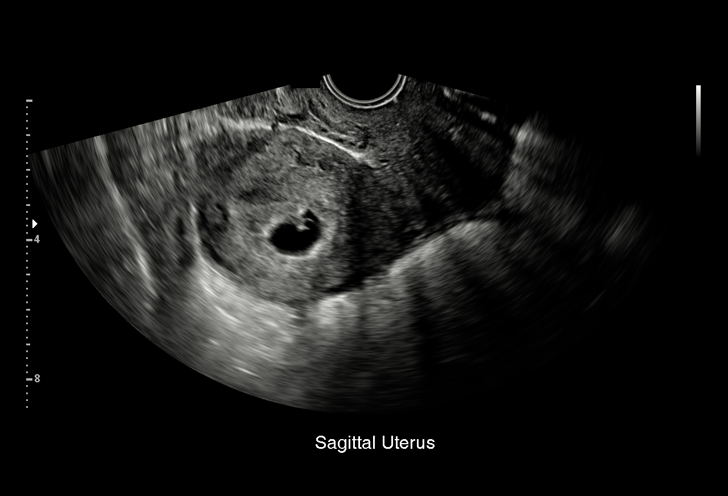
[im 43/78]
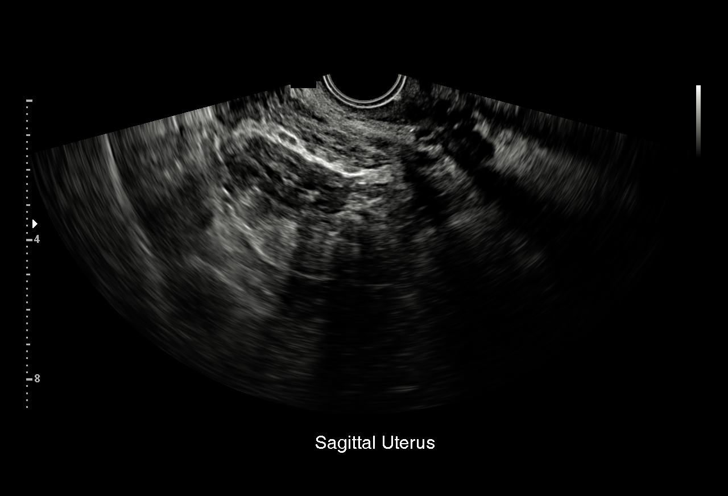
[im 49/78]
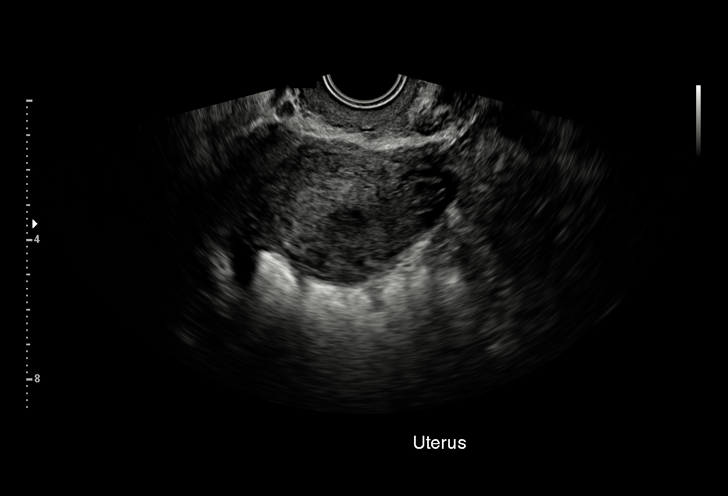
[im 55/78]
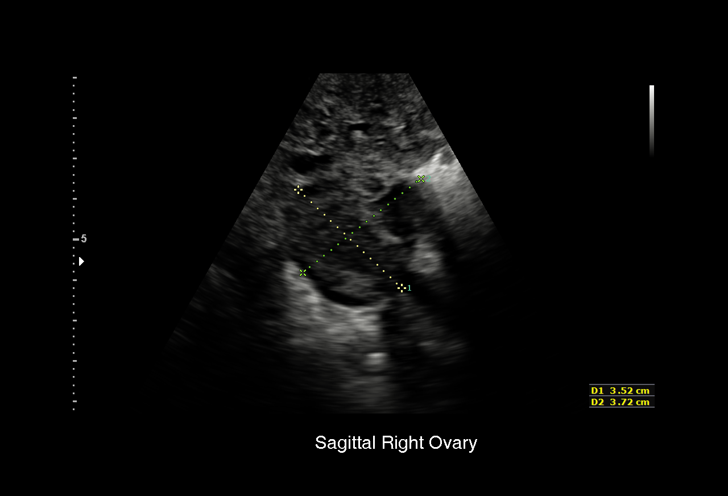
[im 60/78]
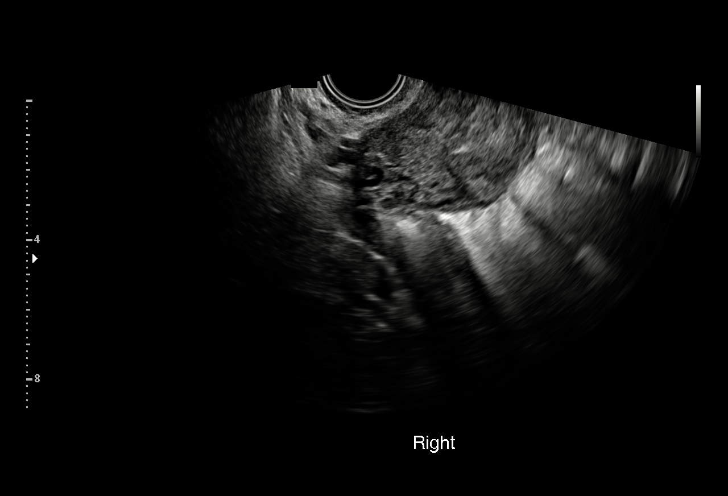
[im 66/78]
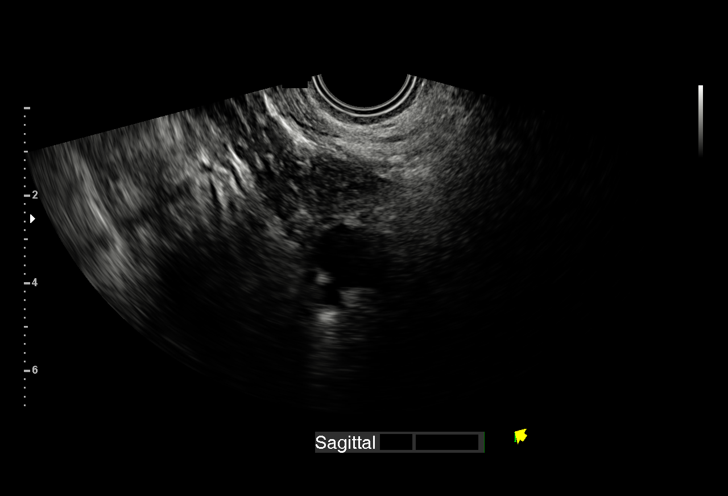
[im 72/78]
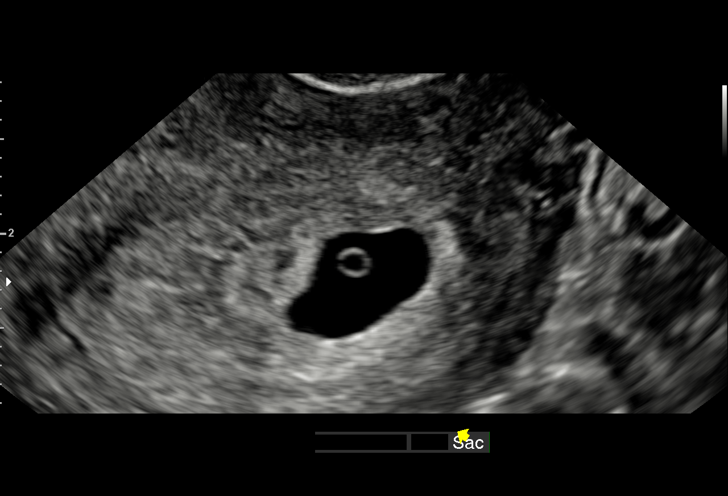
[im 78/78]
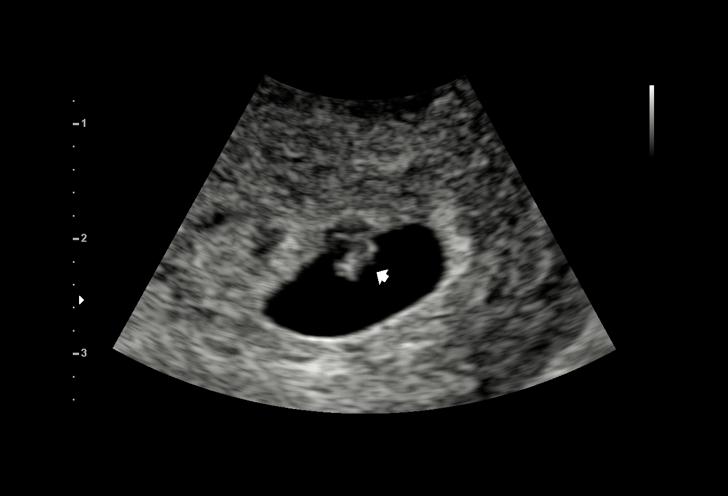

[15 of 28 positions shown; findings below may reference images not displayed]

FINDINGS: Intrauterine gestational sac: Single

Yolk sac:  Visualized.

Embryo:  Visualized.

Cardiac Activity: Visualized.

Heart Rate: 112  bpm

CRL:  4.4  mm   6 w   1 d                  US EDC: 12/07/2017

Subchorionic hemorrhage:  None visualized.

Maternal uterus/adnexae: Bilateral ovaries are within normal limits,
noting a right corpus luteal cyst.

No free fluid.
IMPRESSION: Single live intrauterine gestation, with estimated gestational age 6
weeks 1 day by crown-rump length.

## 2019-05-06 ENCOUNTER — Other Ambulatory Visit: Payer: Self-pay | Admitting: Family Medicine

## 2019-05-06 DIAGNOSIS — J4520 Mild intermittent asthma, uncomplicated: Secondary | ICD-10-CM

## 2019-05-06 MED ORDER — ALBUTEROL SULFATE HFA 108 (90 BASE) MCG/ACT IN AERS: 2.0000 | INHALATION_SPRAY | Freq: Four times a day (QID) | RESPIRATORY_TRACT | 0 refills | Status: DC | PRN

## 2019-10-14 DIAGNOSIS — N760 Acute vaginitis: Secondary | ICD-10-CM | POA: Diagnosis not present

## 2019-10-14 DIAGNOSIS — B373 Candidiasis of vulva and vagina: Secondary | ICD-10-CM | POA: Diagnosis not present

## 2019-10-31 ENCOUNTER — Other Ambulatory Visit: Payer: Self-pay

## 2019-10-31 DIAGNOSIS — Z20822 Contact with and (suspected) exposure to covid-19: Secondary | ICD-10-CM

## 2019-11-03 LAB — NOVEL CORONAVIRUS, NAA: SARS-CoV-2, NAA: NOT DETECTED

## 2019-11-08 ENCOUNTER — Other Ambulatory Visit: Payer: Self-pay | Admitting: Family Medicine

## 2019-11-08 DIAGNOSIS — J4520 Mild intermittent asthma, uncomplicated: Secondary | ICD-10-CM

## 2019-11-08 NOTE — Telephone Encounter (Signed)
Encounter was signed accidentally when trying to route to [facility].  Will resend.

## 2019-11-08 NOTE — Telephone Encounter (Signed)
Medication Refill - Medication: albuterol (VENTOLIN HFA) 108 (90 Base) MCG/ACT inhaler Pt states she also needs the liquid albuterol for the nebulizer.  Pt states that she also has tested positive for COVID so she threw away her mask, cord, and pump for the nebulizer and will also need that sent in.  Has the patient contacted their pharmacy? yes (Agent: If no, request that the patient contact the pharmacy for the refill.) (Agent: If yes, when and what did the pharmacy advise?)  Preferred Pharmacy (with phone number or street name):  [facility], [place] - [address] [facility] [phone] (Phone) [phone] (Fax)   Agent: Please be advised that RX refills may take up to 3 business days. We ask that you follow-up with your pharmacy.

## 2019-11-11 MED ORDER — ALBUTEROL SULFATE HFA 108 (90 BASE) MCG/ACT IN AERS
2.0000 | INHALATION_SPRAY | Freq: Four times a day (QID) | RESPIRATORY_TRACT | 0 refills | Status: DC | PRN
Start: 2019-11-11 — End: 2022-07-28

## 2019-11-11 NOTE — Addendum Note (Signed)
Addended by: Chilton Greathouse on: 11/11/2019 08:57 AM   Modules accepted: Orders

## 2019-11-11 NOTE — Telephone Encounter (Signed)
Pt is scheduled °

## 2019-11-14 ENCOUNTER — Encounter: Payer: Self-pay | Admitting: Family Medicine

## 2019-11-14 ENCOUNTER — Ambulatory Visit (INDEPENDENT_AMBULATORY_CARE_PROVIDER_SITE_OTHER): Payer: BC Managed Care – PPO | Admitting: Family Medicine

## 2019-11-14 ENCOUNTER — Other Ambulatory Visit: Payer: Self-pay

## 2019-11-14 DIAGNOSIS — Z8616 Personal history of COVID-19: Secondary | ICD-10-CM

## 2019-11-14 DIAGNOSIS — J4520 Mild intermittent asthma, uncomplicated: Secondary | ICD-10-CM | POA: Diagnosis not present

## 2019-11-14 DIAGNOSIS — Z8619 Personal history of other infectious and parasitic diseases: Secondary | ICD-10-CM | POA: Diagnosis not present

## 2019-11-14 DIAGNOSIS — A6000 Herpesviral infection of urogenital system, unspecified: Secondary | ICD-10-CM | POA: Diagnosis not present

## 2019-11-14 MED ORDER — ALBUTEROL SULFATE (2.5 MG/3ML) 0.083% IN NEBU
2.5000 mg | INHALATION_SOLUTION | Freq: Four times a day (QID) | RESPIRATORY_TRACT | 1 refills | Status: DC | PRN
Start: 2019-11-14 — End: 2022-07-28

## 2019-11-14 MED ORDER — VALACYCLOVIR HCL 500 MG PO TABS: 500.0000 mg | ORAL_TABLET | Freq: Two times a day (BID) | ORAL | 3 refills | Status: DC

## 2019-11-14 MED ORDER — NEBULIZER DEVI: 1.0000 | Freq: Every day | 0 refills | Status: DC

## 2019-11-14 MED ORDER — NEBULIZER AIR TUBE/PLUGS MISC: 1.0000 | Freq: Every day | 2 refills | Status: DC

## 2019-11-14 NOTE — Progress Notes (Signed)
Name: Patricia Guerrero   MRN: [id number]    DOB: 11-26-1986   Date:11/14/2019       Progress Note  Subjective  Chief Complaint  Chief Complaint  Patient presents with  . Medication Refill  . Asthma    She needs a refill on her Albuterol and supplies for her nebulizer machine.    I connected with  Kristal L Gladhill  on 11/14/19 at  9:20 AM EST by a telephone and verified that I am speaking with the correct person using two identifiers.  I discussed the limitations of evaluation and management by telemedicine and the availability of in person appointments. The patient expressed understanding and agreed to proceed. Staff also discussed with the patient that there may be a patient responsible charge related to this service. Patient Location: at work  Provider Location: [facility]   HPI  Asthma Mild intermittent: she states asthma is well controlled, however she had COVID-19 diagnosed Nov 9th, that caused chest tightness and dry cough, during that period of time she had to use inhaler more often. She states currently just has a mild cough. She has been back to work, no other symptoms   Herpes : no outbreaks, last flare was during pregnancy. Discussed prophylactic medication and she is willing to take medication     Patient Active Problem List   Diagnosis Date Noted  . IUD (intrauterine device) in place 08/22/2018  . General counseling and advice on contraceptive management 05/08/2017  . Genital herpes simplex type 2 04/10/2017  . Mild intermittent asthma without complication 04/10/2017    Past Surgical History:  Procedure Laterality Date  . NO PAST SURGERIES      Family History  Problem Relation Age of Onset  . Diabetes Mother   . Asthma Mother   . Hypertension Father   . Asthma Father   . Hypertension Sister     Social History   Socioeconomic History  . Marital status: Single    Spouse name: Not on file  . Number of children: 4  . Years of  education: Not on file  . Highest education level: Bachelor's degree (e.g., BA, AB, BS)  Occupational History  . Occupation: Event organiser: [employer]  Social Needs  . Financial resource strain: Not hard at all  . Food insecurity    Worry: Never true    Inability: Never true  . Transportation needs    Medical: No    Non-medical: No  Tobacco Use  . Smoking status: Never Smoker  . Smokeless tobacco: Never Used  Substance and Sexual Activity  . Alcohol use: No  . Drug use: No  . Sexual activity: Yes    Partners: Male    Birth control/protection: None  Lifestyle  . Physical activity    Days per week: 0 days    Minutes per session: 0 min  . Stress: Not at all  Relationships  . Social connections    Talks on phone: More than three times a week    Gets together: More than three times a week    Attends religious service: Never    Active member of club or organization: Yes    Attends meetings of clubs or organizations: More than 4 times per year    Relationship status: Divorced  . Intimate partner violence    Fear of current or ex partner: No    Emotionally abused: No    Physically abused: No    Forced sexual  activity: No  Other Topics Concern  . Not on file  Social History Narrative   Married has 4 children and pregnant.    Works full time as a Freight forwarder at [employer].      Current Outpatient Medications:  .  albuterol (VENTOLIN HFA) 108 (90 Base) MCG/ACT inhaler, Inhale 2 puffs into the lungs every 6 (six) hours as needed for wheezing or shortness of breath., Disp: 6.7 g, Rfl: 0  No Known Allergies  I personally reviewed active problem list, medication list, allergies, family history, social history, health maintenance with the patient/caregiver today.   ROS  Ten systems reviewed and is negative except as mentioned in HPI  Objective  Virtual encounter, vitals not obtained.  There is no height or weight on file to calculate BMI.  Physical Exam  Awake,  alert and oriented   PHQ2/9: Depression screen [facility] 2/9 11/14/2019 08/22/2018 04/10/2017 11/24/2015  Decreased Interest 0 - 0 0  Down, Depressed, Hopeless 0 0 0 0  PHQ - 2 Score 0 0 0 0  Altered sleeping 0 0 - -  Tired, decreased energy 0 0 - -  Change in appetite 0 0 - -  Feeling bad or failure about yourself  0 0 - -  Trouble concentrating 0 0 - -  Moving slowly or fidgety/restless 0 0 - -  Suicidal thoughts 0 0 - -  PHQ-9 Score 0 0 - -  Difficult doing work/chores - Not difficult at all - -   PHQ-2/9 Result is negative.    Fall Risk: Fall Risk  11/14/2019 11/16/2018 08/22/2018 04/10/2017 11/24/2015  Falls in the past year? 0 0 No No No  Number falls in past yr: 0 0 - - -  Injury with Fall? 0 0 - - -     Assessment & Plan  1. Mild intermittent asthma without complication  - albuterol (PROVENTIL) (2.5 MG/3ML) 0.083% nebulizer solution; Take 3 mLs (2.5 mg total) by nebulization every 6 (six) hours as needed for wheezing or shortness of breath.  Dispense: 150 mL; Refill: 1 - Respiratory Therapy Supplies (NEBULIZER AIR TUBE/PLUGS) MISC; 1 each by Does not apply route daily.  Dispense: 1 each; Refill: 2 - Respiratory Therapy Supplies (NEBULIZER) DEVI; 1 each by Does not apply route daily.  Dispense: 1 each; Refill: 0  2. History of 2019 novel coronavirus disease (COVID-19)  Recovered , she states first test positive , repeat prior to work negative, only has mild cough now. Not seen by medical personal while sick   3. Genital herpes simplex type 2  - valACYclovir (VALTREX) 500 MG tablet; Take 1 tablet (500 mg total) by mouth 2 (two) times daily.  Dispense: 90 tablet; Refill: 3  I discussed the assessment and treatment plan with the patient. The patient was provided an opportunity to ask questions and all were answered. The patient agreed with the plan and demonstrated an understanding of the instructions.  The patient was advised to call back or seek an in-person evaluation if the  symptoms worsen or if the condition fails to improve as anticipated.  I provided 25 minutes of non-face-to-face time during this encounter.

## 2020-01-02 ENCOUNTER — Ambulatory Visit (INDEPENDENT_AMBULATORY_CARE_PROVIDER_SITE_OTHER): Payer: BC Managed Care – PPO | Admitting: Family Medicine

## 2020-01-02 ENCOUNTER — Other Ambulatory Visit: Payer: Self-pay

## 2020-01-02 ENCOUNTER — Other Ambulatory Visit (HOSPITAL_COMMUNITY)
Admission: RE | Admit: 2020-01-02 | Discharge: 2020-01-02 | Disposition: A | Discharge status: Home / Self Care | Payer: BC Managed Care – PPO | Source: Ambulatory Visit | Attending: Family Medicine | Admitting: Family Medicine

## 2020-01-02 ENCOUNTER — Encounter: Payer: Self-pay | Admitting: Family Medicine

## 2020-01-02 VITALS — BP 110/62 | HR 77 | Temp 97.1°F | Resp 16 | Ht 68.0 in | Wt 177.8 lb

## 2020-01-02 DIAGNOSIS — Z131 Encounter for screening for diabetes mellitus: Secondary | ICD-10-CM | POA: Diagnosis not present

## 2020-01-02 DIAGNOSIS — Z0000 Encounter for general adult medical examination without abnormal findings: Secondary | ICD-10-CM | POA: Diagnosis not present

## 2020-01-02 DIAGNOSIS — Z130 Encounter for screening for diseases of the blood and blood-forming organs and certain disorders involving the immune mechanism: Secondary | ICD-10-CM

## 2020-01-02 DIAGNOSIS — Z1151 Encounter for screening for human papillomavirus (HPV): Secondary | ICD-10-CM | POA: Insufficient documentation

## 2020-01-02 DIAGNOSIS — R87810 Cervical high risk human papillomavirus (HPV) DNA test positive: Secondary | ICD-10-CM | POA: Insufficient documentation

## 2020-01-02 DIAGNOSIS — Z0001 Encounter for general adult medical examination with abnormal findings: Secondary | ICD-10-CM | POA: Insufficient documentation

## 2020-01-02 DIAGNOSIS — Z13220 Encounter for screening for lipoid disorders: Secondary | ICD-10-CM | POA: Diagnosis not present

## 2020-01-02 NOTE — Progress Notes (Signed)
Name: Patricia Guerrero   MRN: [id number]    DOB: 1986/07/02   Date:01/02/2020       Progress Note  Subjective  Chief Complaint  Chief Complaint  Patient presents with  . Annual Exam    HPI  Patient presents for annual CPE.  Diet: she likes fruit, chicken, seafood. She is trying to cut down on eating fast food  Exercise: discussed 150 minutes per week   USPSTF grade A and B recommendations    Office Visit from 01/02/2020 in [facility]  AUDIT-C Score  0     Depression: Phq 9 is  negative Depression screen [facility] 2/9 01/02/2020 11/14/2019 08/22/2018 04/10/2017 11/24/2015  Decreased Interest 0 0 - 0 0  Down, Depressed, Hopeless 0 0 0 0 0  PHQ - 2 Score 0 0 0 0 0  Altered sleeping 1 0 0 - -  Tired, decreased energy 0 0 0 - -  Change in appetite 0 0 0 - -  Feeling bad or failure about yourself  0 0 0 - -  Trouble concentrating 0 0 0 - -  Moving slowly or fidgety/restless 0 0 0 - -  Suicidal thoughts 0 0 0 - -  PHQ-9 Score 1 0 0 - -  Difficult doing work/chores Not difficult at all - Not difficult at all - -   Hypertension: BP Readings from Last 3 Encounters:  01/02/20 110/62  11/16/18 116/68  08/22/18 120/78   Obesity: Wt Readings from Last 3 Encounters:  01/02/20 177 lb 12.8 oz (80.6 kg)  11/16/18 175 lb 11.2 oz (79.7 kg)  08/22/18 175 lb 9.6 oz (79.7 kg)   BMI Readings from Last 3 Encounters:  01/02/20 27.03 kg/m  11/16/18 26.72 kg/m  08/22/18 26.70 kg/m     Hep C Screening: today  STD testing and prevention (HIV/chl/gon/syphilis): not interested except GC Intimate partner violence: negative  Sexual History (Partners/Practices/Protection from [employer] hx STI/Pregnancy Plans): same partner for the past 4 years, he is the father of her two youngest children, they co-parent  Pain during Intercourse: no pain , mild vaginal discharge  Menstrual History/LMP/Abnormal Bleeding: has IUD, she spots every month for about one day  Incontinence Symptoms:   No symptoms   Breast cancer:  - Last Mammogram: N/A - BRCA gene screening: no family history   Osteoporosis: Discussed high calcium and vitamin D supplementation, weight bearing exercises  Cervical cancer screening: today   Skin cancer: Discussed monitoring for atypical lesions  Colorectal cancer: start at 32 yo    Advanced Care Planning: A voluntary discussion about advance care planning including the explanation and discussion of advance directives.  Discussed health care proxy and Living will, and the patient was able to identify a health care proxy as mother .  Patient does not have a living will at present time.  Glucose: No results found for: GLUCOSE, GLUCAP  Patient Active Problem List   Diagnosis Date Noted  . IUD (intrauterine device) in place 08/22/2018  . General counseling and advice on contraceptive management 05/08/2017  . Genital herpes simplex type 2 04/10/2017  . Mild intermittent asthma without complication 85/46/2703    Past Surgical History:  Procedure Laterality Date  . NO PAST SURGERIES      Family History  Problem Relation Age of Onset  . Diabetes Mother   . Asthma Mother   . Hypertension Father   . Asthma Father   . Hypertension Sister     Social History  Socioeconomic History  . Marital status: Single    Spouse name: Not on file  . Number of children: 4  . Years of education: Not on file  . Highest education level: Bachelor's degree (e.g., BA, AB, BS)  Occupational History  . Occupation: Best boy: [employer]  Tobacco Use  . Smoking status: Never Smoker  . Smokeless tobacco: Never Used  Substance and Sexual Activity  . Alcohol use: No  . Drug use: No  . Sexual activity: Yes    Partners: Male    Birth control/protection: I.U.D.  Other Topics Concern  . 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 Difficulty of Paying Living Expenses: Not hard at all  Food Insecurity: No Food Insecurity  . Worried About Charity fundraiser in the Last Year: Never true  . Ran Out of Food in the Last Year: Never true  Transportation Needs: No Transportation Needs  . Lack of Transportation (Medical): No  . Lack of Transportation (Non-Medical): No  Physical Activity: Inactive  . Days of Exercise per Week: 0 days  . Minutes of Exercise per Session: 0 min  Stress: No Stress Concern Present  . Feeling of Stress : Not at all  Social Connections: Moderately Isolated  . Frequency of Communication with Friends and Family: More than three times a week  . Frequency of Social Gatherings with Friends and Family: More than three times a week  . Attends Religious Services: Never  . Active Member of Clubs or Organizations: No  . Attends Archivist Meetings: Never  . Marital Status: Divorced  Human resources officer Violence: Not At Risk  . Fear of Current or Ex-Partner: No  . Emotionally Abused: No  . Physically Abused: No  . Sexually Abused: No     Current Outpatient Medications:  .  albuterol (PROVENTIL) (2.5 MG/3ML) 0.083% nebulizer solution, Take 3 mLs (2.5 mg total) by nebulization every 6 (six) hours as needed for wheezing or shortness of breath., Disp: 150 mL, Rfl: 1 .  albuterol (VENTOLIN HFA) 108 (90 Base) MCG/ACT inhaler, Inhale 2 puffs into the lungs every 6 (six) hours as needed for wheezing or shortness of breath., Disp: 6.7 g, Rfl: 0 .  Respiratory Therapy Supplies (NEBULIZER AIR TUBE/PLUGS) MISC, 1 each by Does not apply route daily., Disp: 1 each, Rfl: 2 .  Respiratory Therapy Supplies (NEBULIZER) DEVI, 1 each by Does not apply route daily., Disp: 1 each, Rfl: 0 .  valACYclovir (VALTREX) 500 MG tablet, Take 1 tablet (500 mg total) by mouth 2 (two) times daily., Disp: 90 tablet, Rfl: 3  No Known Allergies   ROS  Constitutional: Negative for  fever or weight change.  Respiratory: Negative for cough and shortness of breath.   Cardiovascular: Negative for chest pain or palpitations.  Gastrointestinal: Negative for abdominal pain, no bowel changes.  Musculoskeletal: Negative for gait problem or joint swelling.  Skin: Negative for rash.  Neurological: Negative for dizziness or headache.  No other specific complaints in a complete review of systems (except as listed in HPI above).  Objective  Vitals:   01/02/20 0853  BP: 110/62  Pulse: 77  Resp: 16  Temp: (!) 97.1 F (36.2 C)  TempSrc: Temporal  SpO2: 99%  Weight: 177 lb 12.8 oz (80.6  kg)  Height: '5\' 8"'$  (1.727 m)    Body mass index is 27.03 kg/m.  Physical Exam  Constitutional: Patient appears well-developed and well-nourished. No distress.  HENT: Head: Normocephalic and atraumatic. Ears: B TMs ok, no erythema or effusion; Nose: Not done Mouth/Throat: not done Eyes: Conjunctivae and EOM are normal. Pupils are equal, round, and reactive to light. No scleral icterus.  Neck: Normal range of motion. Neck supple. No JVD present. No thyromegaly present.  Cardiovascular: Normal rate, regular rhythm and normal heart sounds.  No murmur heard. No BLE edema. Pulmonary/Chest: Effort normal and breath sounds normal. No respiratory distress. Abdominal: Soft. Bowel sounds are normal, no distension. There is no tenderness. no masses Breast: no lumps or masses, no nipple discharge or rashes, piercing on nipples  FEMALE GENITALIA:  External genitalia normal External urethra normal Vaginal vault normal without discharge or lesions - IUD In place Cervix normal without discharge or lesions Bimanual exam normal without masses RECTAL: not done  Musculoskeletal: Normal range of motion, no joint effusions. No gross deformities Neurological: he is alert and oriented to person, place, and time. No cranial nerve deficit. Coordination, balance, strength, speech and gait are normal.  Skin:  Skin is warm and dry. No rash noted. No erythema. Tattoos, nipple piercing  Psychiatric: Patient has a normal mood and affect. behavior is normal. Judgment and thought content normal.   Recent Results (from the past 2160 hour(s))  Novel Coronavirus, NAA ([facility])     Status: None   Collection Time: 10/31/19 12:36 PM   Specimen: Nasopharyngeal(NP) swabs in vial transport medium   NASOPHARYNGE  TESTING  Result Value Ref Range   SARS-CoV-2, NAA Not Detected Not Detected    Comment: This nucleic acid amplification test was developed and its performance characteristics determined by [employer], [employer] and [employer]. Nucleic acid amplification tests include PCR and TMA. This test has not been FDA cleared or approved. This test has been authorized by FDA under an Emergency Use Authorization (EUA). This test is only authorized for the duration of time the declaration that circumstances exist justifying the authorization of the emergency use of in vitro diagnostic tests for detection of SARS-CoV-2 virus and/or diagnosis of COVID-19 infection under section 564(b)(1) of the Act, 21 U.S.C. 009FGH-8(E) (1), unless the authorization is terminated or revoked sooner. When diagnostic testing is negative, the possibility of a false negative result should be considered in the context of a patient's recent exposures and the presence of clinical signs and symptoms consistent with COVID-19. An individual without symptoms of COVID-19 and who is not shedding SARS-CoV-2 virus would  expect to have a negative (not detected) result in this assay.     Fall Risk: Fall Risk  01/02/2020 11/14/2019 11/16/2018 08/22/2018 04/10/2017  Falls in the past year? 0 0 0 No No  Number falls in past yr: 0 0 0 - -  Injury with Fall? 0 0 0 - -    Functional Status Survey: Is the patient deaf or have difficulty hearing?: No Does the patient have difficulty seeing, even when wearing glasses/contacts?: No Does the patient have difficulty  concentrating, remembering, or making decisions?: No Does the patient have difficulty walking or climbing stairs?: No Does the patient have difficulty dressing or bathing?: No Does the patient have difficulty doing errands alone such as visiting a doctor's office or shopping?: No   Assessment & Plan  1. Well adult exam  - Lipid panel - CBC with Differential/Platelet - COMPLETE METABOLIC PANEL WITH GFR - Cytology -  PAP - Hemoglobin A1c - Hepatitis C antibody  2. Lipid screening  - Lipid panel  3. Diabetes mellitus screening  - Hemoglobin A1c  4. Screening for deficiency anemia  - CBC with Differential/Platelet  -USPSTF grade A and B recommendations reviewed with patient; age-appropriate recommendations, preventive care, screening tests, etc discussed and encouraged; healthy living encouraged; see AVS for patient education given to patient -Discussed importance of 150 minutes of physical activity weekly, eat two servings of fish weekly, eat one serving of tree nuts ( cashews, pistachios, pecans, almonds.Marland Kitchen) every other day, eat 6 servings of fruit/vegetables daily and drink plenty of water and avoid sweet beverages.

## 2020-01-02 NOTE — Patient Instructions (Signed)
Preventive Care 21-32 Years Old, Female Preventive care refers to visits with your health care provider and lifestyle choices that can promote health and wellness. This includes:  A yearly physical exam. This may also be called an annual well check.  Regular dental visits and eye exams.  Immunizations.  Screening for certain conditions.  Healthy lifestyle choices, such as eating a healthy diet, getting regular exercise, not using drugs or products that contain nicotine and tobacco, and limiting alcohol use. What can I expect for my preventive care visit? Physical exam Your health care provider will check your:  Height and weight. This may be used to calculate body mass index (BMI), which tells if you are at a healthy weight.  Heart rate and blood pressure.  Skin for abnormal spots. Counseling Your health care provider may ask you questions about your:  Alcohol, tobacco, and drug use.  Emotional well-being.  Home and relationship well-being.  Sexual activity.  Eating habits.  Work and work environment.  Method of birth control.  Menstrual cycle.  Pregnancy history. What immunizations do I need?  Influenza (flu) vaccine  This is recommended every year. Tetanus, diphtheria, and pertussis (Tdap) vaccine  You may need a Td booster every 10 years. Varicella (chickenpox) vaccine  You may need this if you have not been vaccinated. Human papillomavirus (HPV) vaccine  If recommended by your health care provider, you may need three doses over 6 months. Measles, mumps, and rubella (MMR) vaccine  You may need at least one dose of MMR. You may also need a second dose. Meningococcal conjugate (MenACWY) vaccine  One dose is recommended if you are age 19-21 years and a first-year college student living in a residence hall, or if you have one of several medical conditions. You may also need additional booster doses. Pneumococcal conjugate (PCV13) vaccine  You may need  this if you have certain conditions and were not previously vaccinated. Pneumococcal polysaccharide (PPSV23) vaccine  You may need one or two doses if you smoke cigarettes or if you have certain conditions. Hepatitis A vaccine  You may need this if you have certain conditions or if you travel or work in places where you may be exposed to hepatitis A. Hepatitis B vaccine  You may need this if you have certain conditions or if you travel or work in places where you may be exposed to hepatitis B. Haemophilus influenzae type b (Hib) vaccine  You may need this if you have certain conditions. You may receive vaccines as individual doses or as more than one vaccine together in one shot (combination vaccines). Talk with your health care provider about the risks and benefits of combination vaccines. What tests do I need?  Blood tests  Lipid and cholesterol levels. These may be checked every 5 years starting at age 20.  Hepatitis C test.  Hepatitis B test. Screening  Diabetes screening. This is done by checking your blood sugar (glucose) after you have not eaten for a while (fasting).  Sexually transmitted disease (STD) testing.  BRCA-related cancer screening. This may be done if you have a family history of breast, ovarian, tubal, or peritoneal cancers.  Pelvic exam and Pap test. This may be done every 3 years starting at age 21. Starting at age 30, this may be done every 5 years if you have a Pap test in combination with an HPV test. Talk with your health care provider about your test results, treatment options, and if necessary, the need for more tests.   Follow these instructions at home: Eating and drinking   Eat a diet that includes fresh fruits and vegetables, whole grains, lean protein, and low-fat dairy.  Take vitamin and mineral supplements as recommended by your health care provider.  Do not drink alcohol if: ? Your health care provider tells you not to drink. ? You are  pregnant, may be pregnant, or are planning to become pregnant.  If you drink alcohol: ? Limit how much you have to 0-1 drink a day. ? Be aware of how much alcohol is in your drink. In the [place] one drink equals one 12 oz bottle of beer (355 mL), one 5 oz glass of wine (148 mL), or one 1 oz glass of hard liquor (44 mL). Lifestyle  Take daily care of your teeth and gums.  Stay active. Exercise for at least 30 minutes on 5 or more days each week.  Do not use any products that contain nicotine or tobacco, such as cigarettes, e-cigarettes, and chewing tobacco. If you need help quitting, ask your health care provider.  If you are sexually active, practice safe sex. Use a condom or other form of birth control (contraception) in order to prevent pregnancy and STIs (sexually transmitted infections). If you plan to become pregnant, see your health care provider for a preconception visit. What's next?  Visit your health care provider once a year for a well check visit.  Ask your health care provider how often you should have your eyes and teeth checked.  Stay up to date on all vaccines. This information is not intended to replace advice given to you by your health care provider. Make sure you discuss any questions you have with your health care provider. Document Revised: 08/09/2018 Document Reviewed: 08/09/2018 Elsevier Patient Education  2020 [employer].

## 2020-01-03 LAB — CBC WITH DIFFERENTIAL/PLATELET
Absolute Monocytes: 311 cells/uL (ref 200–950)
Basophils Absolute: 31 cells/uL (ref 0–200)
Basophils Relative: 0.6 %
Eosinophils Absolute: 51 cells/uL (ref 15–500)
Eosinophils Relative: 1 %
HCT: 41 % (ref 35.0–45.0)
Hemoglobin: 13.4 g/dL (ref 11.7–15.5)
Lymphs Abs: 1795 cells/uL (ref 850–3900)
MCH: 27.2 pg (ref 27.0–33.0)
MCHC: 32.7 g/dL (ref 32.0–36.0)
MCV: 83.2 fL (ref 80.0–100.0)
MPV: 10.1 fL (ref 7.5–12.5)
Monocytes Relative: 6.1 %
Neutro Abs: 2912 cells/uL (ref 1500–7800)
Neutrophils Relative %: 57.1 %
Platelets: 289 10*3/uL (ref 140–400)
RBC: 4.93 10*6/uL (ref 3.80–5.10)
RDW: 12.9 % (ref 11.0–15.0)
Total Lymphocyte: 35.2 %
WBC: 5.1 10*3/uL (ref 3.8–10.8)

## 2020-01-03 LAB — HEMOGLOBIN A1C
Hgb A1c MFr Bld: 5.4 % of total Hgb (ref <5.7)
Mean Plasma Glucose: 108 (calc)
eAG (mmol/L): 6 (calc)

## 2020-01-03 LAB — COMPLETE METABOLIC PANEL WITH GFR
AG Ratio: 1.7 (calc) (ref 1.0–2.5)
ALT: 9 U/L (ref 6–29)
AST: 11 U/L (ref 10–30)
Albumin: 4.5 g/dL (ref 3.6–5.1)
Alkaline phosphatase (APISO): 54 U/L (ref 31–125)
BUN: 13 mg/dL (ref 7–25)
CO2: 29 mmol/L (ref 20–32)
Calcium: 10.1 mg/dL (ref 8.6–10.2)
Chloride: 106 mmol/L (ref 98–110)
Creat: 0.8 mg/dL (ref 0.50–1.10)
GFR, Est African American: 114 mL/min/{1.73_m2} (ref >=60)
GFR, Est Non African American: 98 mL/min/{1.73_m2} (ref >=60)
Globulin: 2.6 g/dL (calc) (ref 1.9–3.7)
Glucose, Bld: 91 mg/dL (ref 65–99)
Potassium: 4 mmol/L (ref 3.5–5.3)
Sodium: 142 mmol/L (ref 135–146)
Total Bilirubin: 0.5 mg/dL (ref 0.2–1.2)
Total Protein: 7.1 g/dL (ref 6.1–8.1)

## 2020-01-03 LAB — LIPID PANEL
Cholesterol: 163 mg/dL (ref <200)
HDL: 53 mg/dL (ref >=50)
LDL Cholesterol (Calc): 96 mg/dL (calc)
Non-HDL Cholesterol (Calc): 110 mg/dL (calc) (ref <130)
Total CHOL/HDL Ratio: 3.1 (calc) (ref <5.0)
Triglycerides: 53 mg/dL (ref <150)

## 2020-01-03 LAB — HEPATITIS C ANTIBODY
Hepatitis C Ab: NONREACTIVE
SIGNAL TO CUT-OFF: 0.01 (ref <1.00)

## 2020-01-07 LAB — CYTOLOGY - PAP
Chlamydia: NEGATIVE
Comment: NEGATIVE
Comment: NEGATIVE
Comment: NEGATIVE
Comment: NORMAL
Diagnosis: NEGATIVE
High risk HPV: POSITIVE — AB
Neisseria Gonorrhea: NEGATIVE
Trichomonas: NEGATIVE

## 2020-07-02 ENCOUNTER — Ambulatory Visit: Payer: BC Managed Care – PPO | Admitting: Family Medicine

## 2021-01-06 NOTE — Progress Notes (Unsigned)
Name: Patricia Guerrero   MRN: [id number]    DOB: 06-05-1986   Date:01/08/2021       Progress Note  Subjective  Chief Complaint  Annual Exam   HPI  Patient presents for annual CPE.  Diet: high if fruit, trying to eat more vegetables. High in calcium  Exercise: discussed 150 minutes of activity per week   [place] Office Visit from 01/08/2021 in [facility]  AUDIT-C Score 0     Depression: Phq 9 is  negative Depression screen [facility] 2/9 01/08/2021 01/02/2020 11/14/2019 08/22/2018 04/10/2017  Decreased Interest 0 0 0 - 0  Down, Depressed, Hopeless 0 0 0 0 0  PHQ - 2 Score 0 0 0 0 0  Altered sleeping 0 1 0 0 -  Tired, decreased energy 1 0 0 0 -  Change in appetite 0 0 0 0 -  Feeling bad or failure about yourself  0 0 0 0 -  Trouble concentrating 0 0 0 0 -  Moving slowly or fidgety/restless 0 0 0 0 -  Suicidal thoughts 0 0 0 0 -  PHQ-9 Score 1 1 0 0 -  Difficult doing work/chores - Not difficult at all - Not difficult at all -   Hypertension: BP Readings from Last 3 Encounters:  01/08/21 118/78  01/02/20 110/62  11/16/18 116/68   Obesity: Wt Readings from Last 3 Encounters:  01/08/21 181 lb 9.6 oz (82.4 kg)  01/02/20 177 lb 12.8 oz (80.6 kg)  11/16/18 175 lb 11.2 oz (79.7 kg)   BMI Readings from Last 3 Encounters:  01/08/21 27.61 kg/m  01/02/20 27.03 kg/m  11/16/18 26.72 kg/m     Vaccines:   HPV: refused Pneumonia: N/A educated and discussed with patient. Flu: refused  Hep C Screening: 01/02/2020 STD testing and prevention (HIV/chl/gon/syphilis): she does not want labs, but okay to do it on pap  Intimate partner violence: negative screen  Sexual History : no pain during sex, has IUD and is getting replaced Feb 7 th  Menstrual History/LMP/Abnormal Bleeding: no cycles on IUD  Incontinence Symptoms: no problems   Breast cancer:  - Last Mammogram: N/A - BRCA gene screening: N/A  Osteoporosis: Discussed high calcium and vitamin D  supplementation, weight bearing exercises  Cervical cancer screening: 01/02/2020 (+High risk HPV) PAP repeated at today's visit.   Skin cancer: Discussed monitoring for atypical lesions  Colorectal cancer: N/A  ECG: N/A  Advanced Care Planning: A voluntary discussion about advance care planning including the explanation and discussion of advance directives.  Discussed health care proxy and Living will, and the patient was able to identify a health care proxy as mother   Lipids: Lab Results  Component Value Date   CHOL 163 01/02/2020   Lab Results  Component Value Date   HDL 53 01/02/2020   Lab Results  Component Value Date   LDLCALC 96 01/02/2020   Lab Results  Component Value Date   TRIG 53 01/02/2020   Lab Results  Component Value Date   CHOLHDL 3.1 01/02/2020   No results found for: LDLDIRECT  Glucose: Glucose, Bld  Date Value Ref Range Status  01/02/2020 91 65 - 99 mg/dL Final    Comment:    .            Fasting reference interval .     Patient Active Problem List   Diagnosis Date Noted  . IUD (intrauterine device) in place 08/22/2018  . Genital herpes simplex type 2 04/10/2017  .  Mild intermittent asthma without complication 04/10/2017    Past Surgical History:  Procedure Laterality Date  . NO PAST SURGERIES      Family History  Problem Relation Age of Onset  . Diabetes Mother   . Asthma Mother   . Hypertension Father   . Asthma Father   . Hypertension Sister     Social History   Socioeconomic History  . Marital status: Single    Spouse name: Not on file  . Number of children: 4  . Years of education: Not on file  . Highest education level: Bachelor's degree (e.g., BA, AB, BS)  Occupational History  . Occupation: hairdresser     Associate Professor: Production assistant, radio FOR SELF EMPLOYED  Tobacco Use  . Smoking status: Never Smoker  . Smokeless tobacco: Never Used  Vaping Use  . Vaping Use: Never used  Substance and Sexual Activity  . Alcohol use:  No  . Drug use: No  . Sexual activity: Yes    Partners: Male    Birth control/protection: I.U.D.  Other Topics Concern  . 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 Difficulty of Paying Living Expenses: Somewhat hard  Food Insecurity: No Food Insecurity  . Worried About Programme researcher, broadcasting/film/video in the Last Year: Never true  . Ran Out of Food in the Last Year: Never true  Transportation Needs: No Transportation Needs  . Lack of Transportation (Medical): No  . Lack of Transportation (Non-Medical): No  Physical Activity: Inactive  . Days of Exercise per Week: 0 days  . Minutes of Exercise per Session: 0 min  Stress: No Stress Concern Present  . Feeling of Stress : Only a little  Social Connections: Socially Isolated  . Frequency of Communication with Friends and Family: More than three times a week  . Frequency of Social Gatherings with Friends and Family: Three times a week  . Attends Religious Services: Never  . Active Member of Clubs or Organizations: No  . Attends Banker Meetings: Never  . Marital Status: Divorced  Catering manager Violence: Not At Risk  . Fear of Current or Ex-Partner: No  . Emotionally Abused: No  . Physically Abused: No  . Sexually Abused: No     Current Outpatient Medications:  .  albuterol (PROVENTIL) (2.5 MG/3ML) 0.083% nebulizer solution, Take 3 mLs (2.5 mg total) by nebulization every 6 (six) hours as needed for wheezing or shortness of breath., Disp: 150 mL, Rfl: 1 .  albuterol (VENTOLIN HFA) 108 (90 Base) MCG/ACT inhaler, Inhale 2 puffs into the lungs every 6 (six) hours as needed for wheezing or shortness of breath., Disp: 6.7 g, Rfl: 0 .  Respiratory Therapy Supplies (NEBULIZER AIR TUBE/PLUGS) MISC, 1 each by Does not apply route daily., Disp: 1 each, Rfl: 2 .   Respiratory Therapy Supplies (NEBULIZER) DEVI, 1 each by Does not apply route daily., Disp: 1 each, Rfl: 0 .  valACYclovir (VALTREX) 500 MG tablet, Take 1 tablet (500 mg total) by mouth 2 (two) times daily., Disp: 90 tablet, Rfl: 3  No Known Allergies   ROS  Constitutional: Negative for fever or weight change.  Respiratory: Negative for cough and shortness of breath.   Cardiovascular: Negative for chest pain or palpitations.  Gastrointestinal: Negative for abdominal pain, no bowel changes.  Musculoskeletal: Negative for gait problem or joint swelling.  Skin: Negative for rash.  Neurological: Negative for dizziness or headache.  No other specific complaints in a complete review of systems (except as listed in HPI above).  Objective  Vitals:   01/08/21 0851  BP: 118/78  Pulse: 87  Resp: 16  Temp: 98.1 F (36.7 C)  TempSrc: Oral  SpO2: 99%  Weight: 181 lb 9.6 oz (82.4 kg)  Height: $Remove'5\' 8"'RMnDdav$  (1.727 m)    Body mass index is 27.61 kg/m.  Physical Exam  Constitutional: Patient appears well-developed and well-nourished. No distress.  HENT: Head: Normocephalic and atraumatic. Ears: B TMs ok, no erythema or effusion; Nose: Not done  Mouth/Throat:not done  Eyes: Conjunctivae and EOM are normal. Pupils are equal, round, and reactive to light. No scleral icterus.  Neck: Normal range of motion. Neck supple. No JVD present. No thyromegaly present.  Cardiovascular: Normal rate, regular rhythm and normal heart sounds.  No murmur heard. No BLE edema. Pulmonary/Chest: Effort normal and breath sounds normal. No respiratory distress. Abdominal: Soft. Bowel sounds are normal, no distension. There is no tenderness. no masses Breast: no lumps or masses, no nipple discharge or rashes FEMALE GENITALIA:  External genitalia normal External urethra normal Vaginal vault normal without discharge or lesions Cervix normal without discharge or lesions Bimanual exam normal without masses RECTAL:not done   Musculoskeletal: Normal range of motion, no joint effusions. No gross deformities Neurological: he is alert and oriented to person, place, and time. No cranial nerve deficit. Coordination, balance, strength, speech and gait are normal.  Skin: Skin is warm and dry. No rash noted. No erythema.  Psychiatric: Patient has a normal mood and affect. behavior is normal. Judgment and thought content normal.  Fall Risk: Fall Risk  01/08/2021 01/02/2020 11/14/2019 11/16/2018 08/22/2018  Falls in the past year? 0 0 0 0 No  Number falls in past yr: 0 0 0 0 -  Injury with Fall? 0 0 0 0 -     Functional Status Survey: Is the patient deaf or have difficulty hearing?: No Does the patient have difficulty seeing, even when wearing glasses/contacts?: No Does the patient have difficulty concentrating, remembering, or making decisions?: No Does the patient have difficulty walking or climbing stairs?: No Does the patient have difficulty dressing or bathing?: No Does the patient have difficulty doing errands alone such as visiting a doctor's office or shopping?: No   Assessment & Plan  1. Well adult exam   2. Cervical high risk HPV (human papillomavirus) test positive  - Cytology - PAP  3. Cervical cancer screening  - Cytology - PAP  -USPSTF grade A and B recommendations reviewed with patient; age-appropriate recommendations, preventive care, screening tests, etc discussed and encouraged; healthy living encouraged; see AVS for patient education given to patient -Discussed importance of 150 minutes of physical activity weekly, eat two servings of fish weekly, eat one serving of tree nuts ( cashews, pistachios, pecans, almonds.Marland Kitchen) every other day, eat 6 servings of fruit/vegetables daily and drink plenty of water and avoid sweet beverages.

## 2021-01-06 NOTE — Patient Instructions (Signed)
Preventive Care 21-33 Years Old, Female Preventive care refers to lifestyle choices and visits with your health care provider that can promote health and wellness. This includes:  A yearly physical exam. This is also called an annual wellness visit.  Regular dental and eye exams.  Immunizations.  Screening for certain conditions.  Healthy lifestyle choices, such as: ? Eating a healthy diet. ? Getting regular exercise. ? Not using drugs or products that contain nicotine and tobacco. ? Limiting alcohol use. What can I expect for my preventive care visit? Physical exam Your health care provider may check your:  Height and weight. These may be used to calculate your BMI (body mass index). BMI is a measurement that tells if you are at a healthy weight.  Heart rate and blood pressure.  Body temperature.  Skin for abnormal spots. Counseling Your health care provider may ask you questions about your:  Past medical problems.  Family's medical history.  Alcohol, tobacco, and drug use.  Emotional well-being.  Home life and relationship well-being.  Sexual activity.  Diet, exercise, and sleep habits.  Work and work environment.  Access to firearms.  Method of birth control.  Menstrual cycle.  Pregnancy history. What immunizations do I need? Vaccines are usually given at various ages, according to a schedule. Your health care provider will recommend vaccines for you based on your age, medical history, and lifestyle or other factors, such as travel or where you work.   What tests do I need? Blood tests  Lipid and cholesterol levels. These may be checked every 5 years starting at age 20.  Hepatitis C test.  Hepatitis B test. Screening  Diabetes screening. This is done by checking your blood sugar (glucose) after you have not eaten for a while (fasting).  STD (sexually transmitted disease) testing, if you are at risk.  BRCA-related cancer screening. This may be  done if you have a family history of breast, ovarian, tubal, or peritoneal cancers.  Pelvic exam and Pap test. This may be done every 3 years starting at age 21. Starting at age 30, this may be done every 5 years if you have a Pap test in combination with an HPV test. Talk with your health care provider about your test results, treatment options, and if necessary, the need for more tests.   Follow these instructions at home: Eating and drinking  Eat a healthy diet that includes fresh fruits and vegetables, whole grains, lean protein, and low-fat dairy products.  Take vitamin and mineral supplements as recommended by your health care provider.  Do not drink alcohol if: ? Your health care provider tells you not to drink. ? You are pregnant, may be pregnant, or are planning to become pregnant.  If you drink alcohol: ? Limit how much you have to 0-1 drink a day. ? Be aware of how much alcohol is in your drink. In the [place] one drink equals one 12 oz bottle of beer (355 mL), one 5 oz glass of wine (148 mL), or one 1 oz glass of hard liquor (44 mL).   Lifestyle  Take daily care of your teeth and gums. Brush your teeth every morning and night with fluoride toothpaste. Floss one time each day.  Stay active. Exercise for at least 30 minutes 5 or more days each week.  Do not use any products that contain nicotine or tobacco, such as cigarettes, e-cigarettes, and chewing tobacco. If you need help quitting, ask your health care provider.  Do not   use drugs.  If you are sexually active, practice safe sex. Use a condom or other form of protection to prevent STIs (sexually transmitted infections).  If you do not wish to become pregnant, use a form of birth control. If you plan to become pregnant, see your health care provider for a prepregnancy visit.  Find healthy ways to cope with stress, such as: ? Meditation, yoga, or listening to music. ? Journaling. ? Talking to a trusted  person. ? Spending time with friends and family. Safety  Always wear your seat belt while driving or riding in a vehicle.  Do not drive: ? If you have been drinking alcohol. Do not ride with someone who has been drinking. ? When you are tired or distracted. ? While texting.  Wear a helmet and other protective equipment during sports activities.  If you have firearms in your house, make sure you follow all gun safety procedures.  Seek help if you have been physically or sexually abused. What's next?  Go to your health care provider once a year for an annual wellness visit.  Ask your health care provider how often you should have your eyes and teeth checked.  Stay up to date on all vaccines. This information is not intended to replace advice given to you by your health care provider. Make sure you discuss any questions you have with your health care provider. Document Revised: 07/26/2020 Document Reviewed: 08/09/2018 Elsevier Patient Education  2021 Elsevier Inc.  

## 2021-01-08 ENCOUNTER — Other Ambulatory Visit: Payer: Self-pay

## 2021-01-08 ENCOUNTER — Other Ambulatory Visit (HOSPITAL_COMMUNITY)
Admission: RE | Admit: 2021-01-08 | Discharge: 2021-01-08 | Disposition: A | Discharge status: Home / Self Care | Payer: BC Managed Care – PPO | Source: Ambulatory Visit | Attending: Family Medicine | Admitting: Family Medicine

## 2021-01-08 ENCOUNTER — Encounter: Payer: Self-pay | Admitting: Family Medicine

## 2021-01-08 ENCOUNTER — Ambulatory Visit (INDEPENDENT_AMBULATORY_CARE_PROVIDER_SITE_OTHER): Payer: Self-pay | Admitting: Family Medicine

## 2021-01-08 VITALS — BP 118/78 | HR 87 | Temp 98.1°F | Resp 16 | Ht 68.0 in | Wt 181.6 lb

## 2021-01-08 DIAGNOSIS — Z0000 Encounter for general adult medical examination without abnormal findings: Secondary | ICD-10-CM

## 2021-01-08 DIAGNOSIS — Z124 Encounter for screening for malignant neoplasm of cervix: Secondary | ICD-10-CM | POA: Insufficient documentation

## 2021-01-08 DIAGNOSIS — R87810 Cervical high risk human papillomavirus (HPV) DNA test positive: Secondary | ICD-10-CM | POA: Insufficient documentation

## 2021-01-12 LAB — CYTOLOGY - PAP
Chlamydia: NEGATIVE
Comment: NEGATIVE
Comment: NEGATIVE
Comment: NORMAL
Diagnosis: NEGATIVE
High risk HPV: NEGATIVE
Neisseria Gonorrhea: NEGATIVE

## 2021-05-11 ENCOUNTER — Ambulatory Visit: Payer: Self-pay | Admitting: Unknown Physician Specialty

## 2022-01-10 ENCOUNTER — Encounter: Payer: Self-pay | Admitting: Family Medicine

## 2022-01-11 ENCOUNTER — Encounter (HOSPITAL_COMMUNITY): Payer: Self-pay | Admitting: Radiology

## 2022-01-11 ENCOUNTER — Other Ambulatory Visit: Payer: Self-pay

## 2022-01-11 ENCOUNTER — Emergency Department (HOSPITAL_COMMUNITY)
Admission: EM | Admit: 2022-01-11 | Discharge: 2022-01-11 | Disposition: A | Discharge status: Home / Self Care | Payer: Self-pay | Attending: Emergency Medicine | Admitting: Emergency Medicine

## 2022-01-11 ENCOUNTER — Emergency Department (HOSPITAL_COMMUNITY): Payer: Self-pay

## 2022-01-11 DIAGNOSIS — J45909 Unspecified asthma, uncomplicated: Secondary | ICD-10-CM | POA: Insufficient documentation

## 2022-01-11 DIAGNOSIS — L03211 Cellulitis of face: Secondary | ICD-10-CM | POA: Insufficient documentation

## 2022-01-11 DIAGNOSIS — K122 Cellulitis and abscess of mouth: Secondary | ICD-10-CM | POA: Insufficient documentation

## 2022-01-11 DIAGNOSIS — R599 Enlarged lymph nodes, unspecified: Secondary | ICD-10-CM | POA: Insufficient documentation

## 2022-01-11 DIAGNOSIS — K0889 Other specified disorders of teeth and supporting structures: Secondary | ICD-10-CM

## 2022-01-11 LAB — CBC WITH DIFFERENTIAL/PLATELET
Abs Immature Granulocytes: 0.03 10*3/uL (ref 0.00–0.07)
Basophils Absolute: 0 10*3/uL (ref 0.0–0.1)
Basophils Relative: 0 %
Eosinophils Absolute: 0.1 10*3/uL (ref 0.0–0.5)
Eosinophils Relative: 1 %
HCT: 44 % (ref 36.0–46.0)
Hemoglobin: 14.8 g/dL (ref 12.0–15.0)
Immature Granulocytes: 0 %
Lymphocytes Relative: 29 %
Lymphs Abs: 2.1 10*3/uL (ref 0.7–4.0)
MCH: 27.8 pg (ref 26.0–34.0)
MCHC: 33.6 g/dL (ref 30.0–36.0)
MCV: 82.6 fL (ref 80.0–100.0)
Monocytes Absolute: 0.4 10*3/uL (ref 0.1–1.0)
Monocytes Relative: 5 %
Neutro Abs: 4.7 10*3/uL (ref 1.7–7.7)
Neutrophils Relative %: 65 %
Platelets: 278 10*3/uL (ref 150–400)
RBC: 5.33 MIL/uL — ABNORMAL HIGH (ref 3.87–5.11)
RDW: 12.4 % (ref 11.5–15.5)
WBC: 7.4 10*3/uL (ref 4.0–10.5)
nRBC: 0 % (ref 0.0–0.2)

## 2022-01-11 LAB — BASIC METABOLIC PANEL
Anion gap: 10 (ref 5–15)
BUN: 8 mg/dL (ref 6–20)
CO2: 22 mmol/L (ref 22–32)
Calcium: 10.2 mg/dL (ref 8.9–10.3)
Chloride: 107 mmol/L (ref 98–111)
Creatinine, Ser: 0.83 mg/dL (ref 0.44–1.00)
GFR, Estimated: >60 mL/min (ref >60)
Glucose, Bld: 100 mg/dL — ABNORMAL HIGH (ref 70–99)
Potassium: 3.6 mmol/L (ref 3.5–5.1)
Sodium: 139 mmol/L (ref 135–145)

## 2022-01-11 MED ORDER — CLINDAMYCIN HCL 150 MG PO CAPS: 300.0000 mg | ORAL_CAPSULE | Freq: Four times a day (QID) | ORAL | 0 refills | Status: AC

## 2022-01-11 MED ORDER — IOHEXOL 300 MG/ML  SOLN
75.0000 mL | Freq: Once | INTRAMUSCULAR | Status: AC | PRN
  Administered 2022-01-11: 75 mL via INTRAVENOUS

## 2022-01-11 NOTE — ED Provider Triage Note (Signed)
Emergency Medicine Provider Triage Evaluation Note  Patricia Guerrero , a 36 y.o. female  was evaluated in triage.  Pt complains of facial swelling. States that same began 2 days ago and is located under her chin. States that originally the swelling was very small but when she woke up this morning it has drastically increased in size. Notes that she has had several URIs recently with COVID at the end of December. However does state that those symptoms have completely resolved. 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 Denies any difficulty swallowing or voice changes  Review of Systems  Positive:  Negative: See above  Physical Exam  BP (!) 127/93 (BP Location: Right Arm)    Pulse 61    Temp 99.5 F (37.5 C) (Oral)    Resp 14    SpO2 100%  Gen:   Awake, no distress   Resp:  Normal effort  MSK:   Moves extremities without difficulty  Other:  Significantly swollen region of induration in the submental region. No signs of Ludwigs angina  Medical Decision Making  Medically screening exam initiated at 11:13 AM.  Appropriate orders placed.  Consuella L Vargus was informed that the remainder of the evaluation will be completed by another provider, this initial triage assessment does not replace that evaluation, and the importance of remaining in the ED until their evaluation is complete.     Silva Bandy, PA-C 01/11/22 1119

## 2022-01-11 NOTE — ED Provider Notes (Signed)
[facility] EMERGENCY DEPARTMENT Provider Note   CSN: [id number] Arrival date & time: 01/11/22  1038     History  No chief complaint on file.   Patricia Guerrero is a 36 y.o. female.  HPI     36 year old female with history of asthma presents with concern for lesion on her face, lesion under her chin, and dental pain.  Reports she felt this area like a ball underneath her chin approximately 2 days ago.  She also noticed an area on the left side of her cheek which became red.  Reports this area is painful.  Denies any associated fevers, chills, nausea, vomiting, chest pain, shortness of breath.  Reports that she does have left posterior lower tooth pain.  Which is also new.  Denies difficulty swallowing, drooling, shortness of breath.  She does not currently have a dentist   Past Medical History:  Diagnosis Date   Asthma      Home Medications Prior to Admission medications   Medication Sig Start Date End Date Taking? Authorizing Provider  clindamycin (CLEOCIN) 150 MG capsule Take 2 capsules (300 mg total) by mouth 4 (four) times daily for 10 days. 01/11/22 01/21/22 Yes Alvira Monday, MD  albuterol (PROVENTIL) (2.5 MG/3ML) 0.083% nebulizer solution Take 3 mLs (2.5 mg total) by nebulization every 6 (six) hours as needed for wheezing or shortness of breath. Patient not taking: Reported on 01/11/2022 11/14/19   Alba Cory, MD  albuterol (VENTOLIN HFA) 108 (90 Base) MCG/ACT inhaler Inhale 2 puffs into the lungs every 6 (six) hours as needed for wheezing or shortness of breath. Patient not taking: Reported on 01/11/2022 11/11/19   Alba Cory, MD  Respiratory Therapy Supplies (NEBULIZER AIR TUBE/PLUGS) MISC 1 each by Does not apply route daily. 11/14/19   Alba Cory, MD  Respiratory Therapy Supplies (NEBULIZER) DEVI 1 each by Does not apply route daily. 11/14/19   Alba Cory, MD  valACYclovir (VALTREX) 500 MG tablet Take 1 tablet (500 mg total) by  mouth 2 (two) times daily. Patient not taking: Reported on 01/11/2022 11/14/19   Alba Cory, MD      Allergies    Patient has no known allergies.    Review of Systems   Review of Systems See above   Physical Exam Updated Vital Signs BP 107/68    Pulse 60    Temp 99 F (37.2 C) (Oral)    Resp 15    SpO2 99%  Physical Exam Vitals and nursing note reviewed.  Constitutional:      General: She is not in acute distress.    Appearance: She is well-developed. She is not diaphoretic.  HENT:     Head: Normocephalic and atraumatic.     Comments: Erythema to 1cm area nasolabial fold, no fluctuance, mild induration Eyes:     Conjunctiva/sclera: Conjunctivae normal.  Cardiovascular:     Rate and Rhythm: Normal rate and regular rhythm.  Pulmonary:     Effort: Pulmonary effort is normal. No respiratory distress.  Musculoskeletal:        General: No tenderness.     Cervical back: Normal range of motion.  Lymphadenopathy:     Cervical: No cervical adenopathy (submental lympahdenopathy tender).  Skin:    General: Skin is warm and dry.     Findings: No erythema or rash.  Neurological:     Mental Status: She is alert and oriented to person, place, and time.    ED Results / Procedures / Treatments  Labs (all labs ordered are listed, but only abnormal results are displayed) Labs Reviewed  CBC WITH DIFFERENTIAL/PLATELET - Abnormal; Notable for the following components:      Result Value   RBC 5.33 (*)    All other components within normal limits  BASIC METABOLIC PANEL - Abnormal; Notable for the following components:   Glucose, Bld 100 (*)    All other components within normal limits    EKG None  Radiology CT Maxillofacial W Contrast  Result Date: 01/11/2022 CLINICAL DATA:  Maxillary/facial abscess EXAM: CT MAXILLOFACIAL WITH CONTRAST TECHNIQUE: Multidetector CT imaging of the maxillofacial structures was performed with intravenous contrast. Multiplanar CT image  reconstructions were also generated. RADIATION DOSE REDUCTION: This exam was performed according to the departmental dose-optimization program which includes automated exposure control, adjustment of the mA and/or kV according to patient size and/or use of iterative reconstruction technique. CONTRAST:  20mL OMNIPAQUE IOHEXOL 300 MG/ML  SOLN COMPARISON:  None. FINDINGS: Osseous: No fracture or mandibular dislocation. No destructive process. Orbits: Negative. No traumatic or inflammatory finding. Sinuses: Mild paranasal sinus mucosal thickening without air-fluid levels. Soft tissues: Edema/stranding in the subcutaneous soft tissues underlying the floor mouth with scattered prominent lymph nodes in this region. No discrete drainable fluid collection. Inflammatory changes abut the floor mouth musculature. Unremarkable appearance of the submandibular and parotid glands. Limited intracranial: No significant or unexpected finding. IMPRESSION: Edema/stranding in the subcutaneous soft tissues underlying the floor mouth with scattered prominent lymph nodes in this region, suspicious for cellulitis with reactive lymphadenopathy. Electronically Signed   By: Feliberto Harts M.D.   On: 01/11/2022 13:00    Procedures Procedures    Medications Ordered in ED Medications  iohexol (OMNIPAQUE) 300 MG/ML solution 75 mL (75 mLs Intravenous Contrast Given 01/11/22 1243)    ED Course/ Medical Decision Making/ A&P                           Medical Decision Making Risk Prescription drug management.    36 year old female with history of asthma presents with concern for lesion on her face, lesion under her chin, and dental pain.  Labs are completed and advised by me show a normal white blood cell count, no significant electrolyte abnormalities.  CT maxillofacial shows no sign of abscess, does show edema and stranding in the subcutaneous soft tissues underlying the floor of the mouth and scattered prominent lymph nodes  in this region suspicious for cellulitis with reactive lymphadenopathy.  She has very small area of cellulitis or very tiny early abscess of the face, not amenable to incision and drainage at this time.  I do not see signs of airway compromise, necrotizing infection, or indication for admission to the hospital.  Given prescription for clindamycin, # for dentistry follow-up given tooth pain and discussion of strict return precautions.        Final Clinical Impression(s) / ED Diagnoses Final diagnoses:  Cellulitis of face  Reactive lymphadenopathy  Cellulitis of floor of mouth  Pain, dental    Rx / DC Orders ED Discharge Orders          Ordered    clindamycin (CLEOCIN) 150 MG capsule  4 times daily        01/11/22 1334              Alvira Monday, MD 01/11/22 1741

## 2022-01-11 NOTE — ED Triage Notes (Signed)
Pt reports painful swelling under her chin since yesterday and a dime size area of rash on the left side of her face.

## 2022-06-21 ENCOUNTER — Ambulatory Visit: Payer: Self-pay | Admitting: Internal Medicine

## 2022-06-21 ENCOUNTER — Encounter: Payer: Self-pay | Admitting: Internal Medicine

## 2022-06-21 ENCOUNTER — Other Ambulatory Visit (HOSPITAL_COMMUNITY)
Admission: RE | Admit: 2022-06-21 | Discharge: 2022-06-21 | Disposition: A | Discharge status: Home / Self Care | Payer: Self-pay | Source: Ambulatory Visit | Attending: Internal Medicine | Admitting: Internal Medicine

## 2022-06-21 VITALS — BP 116/68 | HR 97 | Temp 98.0°F | Resp 16 | Ht 68.0 in | Wt 186.6 lb

## 2022-06-21 DIAGNOSIS — Z0000 Encounter for general adult medical examination without abnormal findings: Secondary | ICD-10-CM

## 2022-06-21 DIAGNOSIS — N898 Other specified noninflammatory disorders of vagina: Secondary | ICD-10-CM | POA: Insufficient documentation

## 2022-06-21 DIAGNOSIS — Z13220 Encounter for screening for lipoid disorders: Secondary | ICD-10-CM | POA: Diagnosis not present

## 2022-06-21 DIAGNOSIS — Z975 Presence of (intrauterine) contraceptive device: Secondary | ICD-10-CM

## 2022-06-21 NOTE — Progress Notes (Signed)
Name: Patricia Guerrero   MRN: [id number]    DOB: 31-Dec-1985   Date:06/21/2022       Progress Note  Subjective  Chief Complaint  Chief Complaint  Patient presents with   Annual Exam    HPI  Patient presents for annual CPE.  Diet: not well rounded, gained 5 pounds since last year  Exercise: no routine exercise, recommend 150 minutes a week of cardiovascular exercise  Flowsheet Row Office Visit from 01/08/2021 in [facility]  AUDIT-C Score 0      Depression: Phq 9 is  negative    06/21/2022    3:01 PM 01/08/2021    8:45 AM 01/02/2020    8:36 AM 11/14/2019    8:12 AM 08/22/2018   10:00 AM  Depression screen PHQ 2/9  Decreased Interest 0 0 0 0   Down, Depressed, Hopeless 0 0 0 0 0  PHQ - 2 Score 0 0 0 0 0  Altered sleeping 0 0 1 0 0  Tired, decreased energy 0 1 0 0 0  Change in appetite 0 0 0 0 0  Feeling bad or failure about yourself  0 0 0 0 0  Trouble concentrating 0 0 0 0 0  Moving slowly or fidgety/restless 0 0 0 0 0  Suicidal thoughts 0 0 0 0 0  PHQ-9 Score 0 1 1 0 0  Difficult doing work/chores Not difficult at all  Not difficult at all  Not difficult at all   Hypertension: BP Readings from Last 3 Encounters:  06/21/22 116/68  01/11/22 107/68  01/08/21 118/78   Obesity: Wt Readings from Last 3 Encounters:  06/21/22 186 lb 9.6 oz (84.6 kg)  01/08/21 181 lb 9.6 oz (82.4 kg)  01/02/20 177 lb 12.8 oz (80.6 kg)   BMI Readings from Last 3 Encounters:  06/21/22 28.37 kg/m  01/08/21 27.61 kg/m  01/02/20 27.03 kg/m     Vaccines:   HPV: n/a Tdap: 09/2017 Shingrix: n/a Pneumonia: n/a Flu: 08/2018 COVID-19: did not get - not interested    Hep C Screening: 01/02/2020 STD testing and prevention (HIV/chl/gon/syphilis):  Intimate partner violence: negative screen  Sexual History : not currently sexually active, but having some change in vaginal discharge, history of recurrent yeast infections Menstrual History/LMP/Abnormal Bleeding: Mirena  IUD placed in 2018 - having more spotting lately - would like referral to have IUD removed Discussed importance of follow up if any post-menopausal bleeding: not applicable  Incontinence Symptoms: negative for symptoms   Breast cancer:  - Last Mammogram: N/a - no family history of breast cancer   Osteoporosis Prevention : Discussed high calcium and vitamin D supplementation, weight bearing exercises Bone density :not applicable   Cervical cancer screening: Pap 01/08/21 negative for cellular changes and negative for HPV;  01/02/20 negative but positive for high risk HPV   Skin cancer: Discussed monitoring for atypical lesions  Colorectal cancer: N/A  - no family history of colon cancer  Lung cancer:  Low Dose CT Chest recommended if Age 77-80 years, 20 pack-year currently smoking OR have quit w/in 15years. Patient does not qualify for screen    Advanced Care Planning: A voluntary discussion about advance care planning including the explanation and discussion of advance directives.  Discussed health care proxy and Living will, and the patient was able to identify a health care proxy as mother Leola [place].  Patient does not have a living will and power of attorney of health care   Lipids: Lab Results  Component Value Date   CHOL 163 01/02/2020   Lab Results  Component Value Date   HDL 53 01/02/2020   Lab Results  Component Value Date   LDLCALC 96 01/02/2020   Lab Results  Component Value Date   TRIG 53 01/02/2020   Lab Results  Component Value Date   CHOLHDL 3.1 01/02/2020   No results found for: "LDLDIRECT"  Glucose: Glucose, Bld  Date Value Ref Range Status  01/11/2022 100 (H) 70 - 99 mg/dL Final    Comment:    Glucose reference range applies only to samples taken after fasting for at least 8 hours.  01/02/2020 91 65 - 99 mg/dL Final    Comment:    .            Fasting reference interval .     Patient Active Problem List   Diagnosis Date Noted   IUD  (intrauterine device) in place 08/22/2018   Genital herpes simplex type 2 04/10/2017   Mild intermittent asthma without complication 04/10/2017    Past Surgical History:  Procedure Laterality Date   NO PAST SURGERIES      Family History  Problem Relation Age of Onset   Diabetes Mother    Asthma Mother    Hypertension Father    Asthma Father    Hypertension Sister     Social History   Socioeconomic History   Marital status: Single    Spouse name: Not on file   Number of children: 4   Years of education: Not on file   Highest education level: Bachelor's degree (e.g., BA, AB, BS)  Occupational History   Occupation: hairdresser     Associate Professor: Production assistant, radio FOR SELF EMPLOYED  Tobacco Use   Smoking status: Never   Smokeless tobacco: Never  Vaping Use   Vaping Use: Never used  Substance and Sexual Activity   Alcohol use: No   Drug use: No   Sexual activity: Yes    Partners: Male    Birth control/protection: I.U.D.  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 [employer]: Low Risk  (06/21/2022)   Overall Financial Resource Strain (CARDIA)    Difficulty of Paying Living Expenses: Not hard at all  Food Insecurity: No Food Insecurity (06/21/2022)   Hunger Vital Sign    Worried About Running Out of Food in the Last Year: Never true    Ran Out of Food in the Last Year: Never true  Transportation Needs: No Transportation Needs (06/21/2022)   PRAPARE - Administrator, Civil Service (Medical): No    Lack of Transportation (Non-Medical): No  Physical Activity: Insufficiently Active (06/21/2022)   Exercise Vital Sign    Days of Exercise per Week: 2 days    Minutes of Exercise per Session: 30 min  Stress: No Stress Concern Present (06/21/2022)   [employer] of Occupational Health - Occupational  Stress Questionnaire    Feeling of Stress : Only a little  Social Connections: Moderately Integrated (06/21/2022)   Social Connection and Isolation Panel [NHANES]    Frequency of Communication with Friends and Family: More than three times a week    Frequency of Social Gatherings with Friends and Family: More than three times a week    Attends Religious Services: More than 4 times  per year    Active Member of Clubs or Organizations: No    Attends Banker Meetings: Never    Marital Status: Living with partner  Intimate Partner Violence: Not At Risk (06/21/2022)   Humiliation, Afraid, Rape, and Kick questionnaire    Fear of Current or Ex-Partner: No    Emotionally Abused: No    Physically Abused: No    Sexually Abused: No     Current Outpatient Medications:    albuterol (PROVENTIL) (2.5 MG/3ML) 0.083% nebulizer solution, Take 3 mLs (2.5 mg total) by nebulization every 6 (six) hours as needed for wheezing or shortness of breath., Disp: 150 mL, Rfl: 1   albuterol (VENTOLIN HFA) 108 (90 Base) MCG/ACT inhaler, Inhale 2 puffs into the lungs every 6 (six) hours as needed for wheezing or shortness of breath., Disp: 6.7 g, Rfl: 0   Respiratory Therapy Supplies (NEBULIZER AIR TUBE/PLUGS) MISC, 1 each by Does not apply route daily., Disp: 1 each, Rfl: 2   Respiratory Therapy Supplies (NEBULIZER) DEVI, 1 each by Does not apply route daily., Disp: 1 each, Rfl: 0  No Known Allergies   Review of Systems  All other systems reviewed and are negative.   Objective  Vitals:   06/21/22 1458  BP: 116/68  Pulse: 97  Resp: 16  Temp: 98 F (36.7 C)  SpO2: 98%  Weight: 186 lb 9.6 oz (84.6 kg)  Height: 5\' 8"  (1.727 m)    Body mass index is 28.37 kg/m.  Physical Exam Constitutional:      Appearance: Normal appearance.  HENT:     Head: Normocephalic and atraumatic.     Mouth/Throat:     Mouth: Mucous membranes are moist.     Pharynx: Oropharynx is clear.  Eyes:     Extraocular  Movements: Extraocular movements intact.     Conjunctiva/sclera: Conjunctivae normal.     Pupils: Pupils are equal, round, and reactive to light.  Cardiovascular:     Rate and Rhythm: Normal rate and regular rhythm.  Pulmonary:     Effort: Pulmonary effort is normal.     Breath sounds: Normal breath sounds.  Musculoskeletal:     Right lower leg: No edema.     Left lower leg: No edema.  Skin:    General: Skin is warm and dry.  Neurological:     General: No focal deficit present.     Mental Status: She is alert. Mental status is at baseline.  Psychiatric:        Mood and Affect: Mood normal.        Behavior: Behavior normal.      No results found for this or any previous visit (from the past 2160 hour(s)).   Fall Risk:    06/21/2022    3:01 PM 01/08/2021    8:45 AM 01/02/2020    8:36 AM 11/14/2019    8:11 AM 11/16/2018    2:30 PM  Fall Risk   Falls in the past year? 0 0 0 0 0  Number falls in past yr: 0 0 0 0 0  Injury with Fall? 0 0 0 0 0     Functional Status Survey: Is the patient deaf or have difficulty hearing?: No Does the patient have difficulty seeing, even when wearing glasses/contacts?: No Does the patient have difficulty concentrating, remembering, or making decisions?: No Does the patient have difficulty walking or climbing stairs?: No Does the patient have difficulty dressing or bathing?: No Does the patient have difficulty doing  errands alone such as visiting a doctor's office or shopping?: No   Assessment & Plan  1. Annual visit for general adult medical examination without abnormal findings/Lipid screening: Routine screening due, labs ordered.   - COMPLETE METABOLIC PANEL WITH GFR - CBC w/Diff/Platelet - Lipid Profile  2. Vaginal discharge: Cervical screening swab today. - Cervicovaginal ancillary only  3. IUD (intrauterine device) in place: In place since 2018, now having vaginal spotting. Referral placed to Gynecology for IUD removal.   -  Ambulatory referral to Gynecology  -USPSTF grade A and B recommendations reviewed with patient; age-appropriate recommendations, preventive care, screening tests, etc discussed and encouraged; healthy living encouraged; see AVS for patient education given to patient -Discussed importance of 150 minutes of physical activity weekly, eat two servings of fish weekly, eat one serving of tree nuts ( cashews, pistachios, pecans, almonds.Marland Kitchen) every other day, eat 6 servings of fruit/vegetables daily and drink plenty of water and avoid sweet beverages.   -Reviewed Health Maintenance: Yes.

## 2022-06-21 NOTE — Patient Instructions (Signed)
It was great seeing you today!  Plan discussed at today's visit: -Blood work ordered today, results will be uploaded to MyChart.  -Cervical swab today, will call with results later in the week.  Follow up in: 1 year   Take care and let us know if you have any questions or concerns prior to your next visit.  Dr. Caralee Ates  Health Maintenance, Female Adopting a healthy lifestyle and getting preventive care are important in promoting health and wellness. Ask your health care provider about: The right schedule for you to have regular tests and exams. Things you can do on your own to prevent diseases and keep yourself healthy. What should I know about diet, weight, and exercise? Eat a healthy diet  Eat a diet that includes plenty of vegetables, fruits, low-fat dairy products, and lean protein. Do not eat a lot of foods that are high in solid fats, added sugars, or sodium. Maintain a healthy weight Body mass index (BMI) is used to identify weight problems. It estimates body fat based on height and weight. Your health care provider can help determine your BMI and help you achieve or maintain a healthy weight. Get regular exercise Get regular exercise. This is one of the most important things you can do for your health. Most adults should: Exercise for at least 150 minutes each week. The exercise should increase your heart rate and make you sweat (moderate-intensity exercise). Do strengthening exercises at least twice a week. This is in addition to the moderate-intensity exercise. Spend less time sitting. Even light physical activity can be beneficial. Watch cholesterol and blood lipids Have your blood tested for lipids and cholesterol at 34 years of age, then have this test every 5 years. Have your cholesterol levels checked more often if: Your lipid or cholesterol levels are high. You are older than 34 years of age. You are at high risk for heart disease. What should I know about cancer  screening? Depending on your health history and family history, you may need to have cancer screening at various ages. This may include screening for: Breast cancer. Cervical cancer. Colorectal cancer. Skin cancer. Lung cancer. What should I know about heart disease, diabetes, and high blood pressure? Blood pressure and heart disease High blood pressure causes heart disease and increases the risk of stroke. This is more likely to develop in people who have high blood pressure readings or are overweight. Have your blood pressure checked: Every 3-5 years if you are 22-55 years of age. Every year if you are 22 years old or older. Diabetes Have regular diabetes screenings. This checks your fasting blood sugar level. Have the screening done: Once every three years after age 72 if you are at a normal weight and have a low risk for diabetes. More often and at a younger age if you are overweight or have a high risk for diabetes. What should I know about preventing infection? Hepatitis B If you have a higher risk for hepatitis B, you should be screened for this virus. Talk with your health care provider to find out if you are at risk for hepatitis B infection. Hepatitis C Testing is recommended for: Everyone born from [birth year] through 1963. Anyone with known risk factors for hepatitis C. Sexually transmitted infections (STIs) Get screened for STIs, including gonorrhea and chlamydia, if: You are sexually active and are younger than 34 years of age. You are older than 34 years of age and your health care provider tells you that you are  at risk for this type of infection. Your sexual activity has changed since you were last screened, and you are at increased risk for chlamydia or gonorrhea. Ask your health care provider if you are at risk. Ask your health care provider about whether you are at high risk for HIV. Your health care provider may recommend a prescription medicine to help prevent HIV  infection. If you choose to take medicine to prevent HIV, you should first get tested for HIV. You should then be tested every 3 months for as long as you are taking the medicine. Pregnancy If you are about to stop having your period (premenopausal) and you may become pregnant, seek counseling before you get pregnant. Take 400 to 800 micrograms (mcg) of folic acid every day if you become pregnant. Ask for birth control (contraception) if you want to prevent pregnancy. Osteoporosis and menopause Osteoporosis is a disease in which the bones lose minerals and strength with aging. This can result in bone fractures. If you are 38 years old or older, or if you are at risk for osteoporosis and fractures, ask your health care provider if you should: Be screened for bone loss. Take a calcium or vitamin D supplement to lower your risk of fractures. Be given hormone replacement therapy (HRT) to treat symptoms of menopause. Follow these instructions at home: Alcohol use Do not drink alcohol if: Your health care provider tells you not to drink. You are pregnant, may be pregnant, or are planning to become pregnant. If you drink alcohol: Limit how much you have to: 0-1 drink a day. Know how much alcohol is in your drink. In the [place] one drink equals one 12 oz bottle of beer (355 mL), one 5 oz glass of wine (148 mL), or one 1 oz glass of hard liquor (44 mL). Lifestyle Do not use any products that contain nicotine or tobacco. These products include cigarettes, chewing tobacco, and vaping devices, such as e-cigarettes. If you need help quitting, ask your health care provider. Do not use street drugs. Do not share needles. Ask your health care provider for help if you need support or information about quitting drugs. General instructions Schedule regular health, dental, and eye exams. Stay current with your vaccines. Tell your health care provider if: You often feel depressed. You have ever been abused  or do not feel safe at home. Summary Adopting a healthy lifestyle and getting preventive care are important in promoting health and wellness. Follow your health care provider's instructions about healthy diet, exercising, and getting tested or screened for diseases. Follow your health care provider's instructions on monitoring your cholesterol and blood pressure. This information is not intended to replace advice given to you by your health care provider. Make sure you discuss any questions you have with your health care provider. Document Revised: 04/19/2021 Document Reviewed: 04/19/2021 Elsevier Patient Education  2023 [employer].

## 2022-06-22 LAB — CBC WITH DIFFERENTIAL/PLATELET
Absolute Monocytes: 524 cells/uL (ref 200–950)
Basophils Absolute: 54 cells/uL (ref 0–200)
Basophils Relative: 0.7 %
Eosinophils Absolute: 123 cells/uL (ref 15–500)
Eosinophils Relative: 1.6 %
HCT: 42.2 % (ref 35.0–45.0)
Hemoglobin: 13.9 g/dL (ref 11.7–15.5)
Lymphs Abs: 2341 cells/uL (ref 850–3900)
MCH: 27.5 pg (ref 27.0–33.0)
MCHC: 32.9 g/dL (ref 32.0–36.0)
MCV: 83.4 fL (ref 80.0–100.0)
MPV: 10.2 fL (ref 7.5–12.5)
Monocytes Relative: 6.8 %
Neutro Abs: 4659 cells/uL (ref 1500–7800)
Neutrophils Relative %: 60.5 %
Platelets: 294 10*3/uL (ref 140–400)
RBC: 5.06 10*6/uL (ref 3.80–5.10)
RDW: 12.8 % (ref 11.0–15.0)
Total Lymphocyte: 30.4 %
WBC: 7.7 10*3/uL (ref 3.8–10.8)

## 2022-06-22 LAB — COMPLETE METABOLIC PANEL WITH GFR
AG Ratio: 1.8 (calc) (ref 1.0–2.5)
ALT: 15 U/L (ref 6–29)
AST: 16 U/L (ref 10–30)
Albumin: 4.6 g/dL (ref 3.6–5.1)
Alkaline phosphatase (APISO): 64 U/L (ref 31–125)
BUN: 14 mg/dL (ref 7–25)
CO2: 27 mmol/L (ref 20–32)
Calcium: 10 mg/dL (ref 8.6–10.2)
Chloride: 104 mmol/L (ref 98–110)
Creat: 0.85 mg/dL (ref 0.50–0.97)
Globulin: 2.5 g/dL (calc) (ref 1.9–3.7)
Glucose, Bld: 60 mg/dL — ABNORMAL LOW (ref 65–99)
Potassium: 4.1 mmol/L (ref 3.5–5.3)
Sodium: 141 mmol/L (ref 135–146)
Total Bilirubin: 0.4 mg/dL (ref 0.2–1.2)
Total Protein: 7.1 g/dL (ref 6.1–8.1)
eGFR: 93 mL/min/{1.73_m2} (ref >=60)

## 2022-06-22 LAB — LIPID PANEL
Cholesterol: 171 mg/dL (ref <200)
HDL: 59 mg/dL (ref >=50)
LDL Cholesterol (Calc): 90 mg/dL (calc)
Non-HDL Cholesterol (Calc): 112 mg/dL (calc) (ref <130)
Total CHOL/HDL Ratio: 2.9 (calc) (ref <5.0)
Triglycerides: 121 mg/dL (ref <150)

## 2022-06-23 LAB — CERVICOVAGINAL ANCILLARY ONLY
Bacterial Vaginitis (gardnerella): NEGATIVE
Candida Glabrata: NEGATIVE
Candida Vaginitis: NEGATIVE
Chlamydia: NEGATIVE
Comment: NEGATIVE
Comment: NEGATIVE
Comment: NEGATIVE
Comment: NEGATIVE
Comment: NEGATIVE
Comment: NORMAL
Neisseria Gonorrhea: NEGATIVE
Trichomonas: NEGATIVE

## 2022-07-28 ENCOUNTER — Encounter: Payer: Self-pay | Admitting: Obstetrics

## 2022-07-28 ENCOUNTER — Ambulatory Visit (INDEPENDENT_AMBULATORY_CARE_PROVIDER_SITE_OTHER): Payer: BC Managed Care – PPO | Admitting: Obstetrics

## 2022-07-28 VITALS — BP 122/70 | Wt 186.0 lb

## 2022-07-28 DIAGNOSIS — Z30432 Encounter for removal of intrauterine contraceptive device: Secondary | ICD-10-CM

## 2022-07-28 NOTE — Progress Notes (Signed)
Patricia Guerrero is a new patient who presents today as a referral from [facility]. She would like her Mirena IUD removed. She reports that it was placed in 2019 after the birth of her last child. She had the IUD put in at [facility] Initially, she had essentially no bleeding, but over the last year, has started having darker blood flow each month, and is uncomfortable with this. She would like to have the IUD out and have some normal periods as she considers her next birth control method. She has 4 children, and feels that her family is complete, but does not want a BTL. O:BP 122/70   Wt 186 lb (84.4 kg)   BMI 28.28 kg/m  Review of Systems  Constitutional: Negative.   HENT: Negative.    Eyes: Negative.   Respiratory: Negative.    Cardiovascular: Negative.   Skin: Negative.   All other systems reviewed and are negative. Physical Exam Vitals reviewed.  Constitutional:      Appearance: Normal appearance. She is normal weight.  HENT:     Head: Normocephalic and atraumatic.  Cardiovascular:     Rate and Rhythm: Normal rate and regular rhythm.  Pulmonary:     Effort: Pulmonary effort is normal.     Breath sounds: Normal breath sounds.  Abdominal:     General: Abdomen is flat.  Genitourinary:    General: Normal vulva.     Rectum: Normal.     Comments: Normal vaginal mucosa. White, non malodorous discharge noted IUD string visualized protruding from the os by 2 cms. Skin:    General: Skin is warm and dry.  Neurological:     General: No focal deficit present.     Mental Status: She is alert and oriented to person, place, and time.  Psychiatric:        Mood and Affect: Mood normal.        Behavior: Behavior normal.    A:  New patient for IUD removal  Contraceptive education     IUD Removal  Patient identified, informed consent performed, consent signed.  Patient was in the dorsal lithotomy position, normal external genitalia was noted.  A speculum was placed in the patient's  vagina, normal discharge was noted, no lesions. The cervix was visualized, no lesions, no abnormal discharge.  The strings of the IUD were grasped and pulled using ring forceps. The IUD was removed in its entirety.  Patient tolerated the procedure well.   She has not decided on another method. We reviewed LARCs and also discussed BTL which she declines. I have encouraged her to use barrier methods, and return for any other need for contraception. She is not presently sexually active.  Routine preventative health maintenance measures emphasized.    Mirna Mires, CNM  07/28/2022 9:41 AM

## 2022-11-25 ENCOUNTER — Emergency Department
Admission: EM | Admit: 2022-11-25 | Discharge: 2022-11-25 | Disposition: A | Discharge status: Home / Self Care | Payer: BC Managed Care – PPO | Attending: Emergency Medicine | Admitting: Emergency Medicine

## 2022-11-25 ENCOUNTER — Other Ambulatory Visit: Payer: Self-pay

## 2022-11-25 DIAGNOSIS — Y92410 Unspecified street and highway as the place of occurrence of the external cause: Secondary | ICD-10-CM | POA: Insufficient documentation

## 2022-11-25 DIAGNOSIS — M545 Low back pain, unspecified: Secondary | ICD-10-CM | POA: Diagnosis not present

## 2022-11-25 DIAGNOSIS — S335XXA Sprain of ligaments of lumbar spine, initial encounter: Secondary | ICD-10-CM | POA: Diagnosis not present

## 2022-11-25 DIAGNOSIS — S39012A Strain of muscle, fascia and tendon of lower back, initial encounter: Secondary | ICD-10-CM
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
## 2023-02-15 ENCOUNTER — Encounter: Payer: Self-pay | Admitting: Nurse Practitioner

## 2023-02-15 ENCOUNTER — Telehealth: Payer: Self-pay | Admitting: Family Medicine

## 2023-02-15 ENCOUNTER — Ambulatory Visit: Payer: BC Managed Care – PPO | Admitting: Nurse Practitioner

## 2023-02-15 ENCOUNTER — Other Ambulatory Visit (HOSPITAL_COMMUNITY)
Admission: RE | Admit: 2023-02-15 | Discharge: 2023-02-15 | Disposition: A | Discharge status: Home / Self Care | Payer: BC Managed Care – PPO | Source: Ambulatory Visit | Attending: Nurse Practitioner | Admitting: Nurse Practitioner

## 2023-02-15 ENCOUNTER — Other Ambulatory Visit: Payer: Self-pay

## 2023-02-15 ENCOUNTER — Other Ambulatory Visit: Payer: Self-pay | Admitting: Nurse Practitioner

## 2023-02-15 VITALS — BP 120/72 | HR 96 | Temp 98.6°F | Resp 18 | Ht 68.0 in | Wt 192.0 lb

## 2023-02-15 DIAGNOSIS — Z3009 Encounter for other general counseling and advice on contraception: Secondary | ICD-10-CM | POA: Diagnosis not present

## 2023-02-15 DIAGNOSIS — R350 Frequency of micturition: Secondary | ICD-10-CM

## 2023-02-15 DIAGNOSIS — N898 Other specified noninflammatory disorders of vagina: Secondary | ICD-10-CM

## 2023-02-15 DIAGNOSIS — R311 Benign essential microscopic hematuria: Secondary | ICD-10-CM | POA: Diagnosis not present

## 2023-02-15 LAB — POCT URINALYSIS DIPSTICK
Bilirubin, UA: NEGATIVE
Glucose, UA: NEGATIVE
Ketones, UA: NEGATIVE
Nitrite, UA: NEGATIVE
Protein, UA: POSITIVE — AB
Spec Grav, UA: 1.02 (ref 1.010–1.025)
Urobilinogen, UA: 0.2 E.U./dL
pH, UA: 5 (ref 5.0–8.0)

## 2023-02-15 LAB — POCT URINE PREGNANCY: Preg Test, Ur: NEGATIVE

## 2023-02-15 MED ORDER — SULFAMETHOXAZOLE-TRIMETHOPRIM 800-160 MG PO TABS: 1.0000 | ORAL_TABLET | Freq: Two times a day (BID) | ORAL | 0 refills | Status: AC

## 2023-02-15 NOTE — Telephone Encounter (Signed)
Patient was seen today by Patricia Guerrero. I forwarded this message to [place]. Thanks!

## 2023-02-15 NOTE — Telephone Encounter (Signed)
Copied from [place] [phone]. Topic: General - Other >> Feb 15, 2023  4:16 PM Ludger Nutting wrote: The pathology lab is sending back patient's Cervicovaginal ancillary sample due to the name not being on the tube.

## 2023-02-15 NOTE — Progress Notes (Signed)
BP 120/72   Pulse 96   Temp 98.6 F (37 C) (Oral)   Resp 18   Ht '5\' 8"'$  (1.727 m)   Wt 192 lb (87.1 kg)   SpO2 98%   BMI 29.19 kg/m    Subjective:    Patient ID: Patricia Guerrero, female    DOB: February 22, 1986, 37 y.o.   MRN: [id number]  HPI: Patricia Guerrero is a 37 y.o. female  Chief Complaint  Patient presents with   Vaginitis   Hematuria   Vaginal discharge/urinary frequency: patient reports she has had symptoms off and on for about a month. She says she has had some urinary frequency and urgency. She denies any fever or back pain.  She says she has also had a vaginal discharge that is thicker than normal.  She says she does have some lower abdominal pain.  She says that it went away but returned with this recent cycle. She says she just finished her menstrual cycle for this month and feels like symptoms restarted.  Obtained urine dip which was positive for trace blood, protein and leukocytes. Will send urine for culture and obtained vaginal swab.  Will start bactrim.    Relevant past medical, surgical, family and social history reviewed and updated as indicated. Interim medical history since our last visit reviewed. Allergies and medications reviewed and updated.  Review of Systems  Constitutional: Negative for fever or weight change.  Respiratory: Negative for cough and shortness of breath.   Cardiovascular: Negative for chest pain or palpitations.  Gastrointestinal: Negative for abdominal pain, no bowel changes.  GU: positive for urinary frequency, urgency and vaginal discharge Musculoskeletal: Negative for gait problem or joint swelling.  Skin: Negative for rash.  Neurological: Negative for dizziness or headache.  No other specific complaints in a complete review of systems (except as listed in HPI above).      Objective:    BP 120/72   Pulse 96   Temp 98.6 F (37 C) (Oral)   Resp 18   Ht '5\' 8"'$  (1.727 m)   Wt 192 lb (87.1 kg)   SpO2 98%   BMI 29.19 kg/m    Wt Readings from Last 3 Encounters:  02/15/23 192 lb (87.1 kg)  11/25/22 187 lb 6.3 oz (85 kg)  07/28/22 186 lb (84.4 kg)    Physical Exam  Constitutional: Patient appears well-developed and well-nourished. Obese  No distress.  HEENT: head atraumatic, normocephalic, pupils equal and reactive to light, neck supple Cardiovascular: Normal rate, regular rhythm and normal heart sounds.  No murmur heard. No BLE edema. Pulmonary/Chest: Effort normal and breath sounds normal. No respiratory distress. Abdominal: Soft.  Tenderness in suprapubic area. No CVA tenderness Psychiatric: Patient has a normal mood and affect. behavior is normal. Judgment and thought content normal.   Results for orders placed or performed in visit on 02/15/23  POCT urinalysis dipstick  Result Value Ref Range   Color, UA yellow    Clarity, UA clear    Glucose, UA Negative Negative   Bilirubin, UA negative    Ketones, UA negative    Spec Grav, UA 1.020 1.010 - 1.025   Blood, UA trace    pH, UA 5.0 5.0 - 8.0   Protein, UA Positive (A) Negative   Urobilinogen, UA 0.2 0.2 or 1.0 E.U./dL   Nitrite, UA negative    Leukocytes, UA Trace (A) Negative   Appearance clear    Odor none   POCT urine pregnancy  Result Value  Ref Range   Preg Test, Ur Negative Negative      Assessment & Plan:   Problem List Items Addressed This Visit   None Visit Diagnoses     Urinary frequency    -  Primary   start bactrim, push fluids, urine sent for culture   Relevant Medications   sulfamethoxazole-trimethoprim (BACTRIM DS) 800-160 MG tablet   Other Relevant Orders   POCT urinalysis dipstick (Completed)   Urine Culture   POCT urine pregnancy (Completed)   Birth control counseling       wants iud, referral placed to gyn   Relevant Orders   Ambulatory referral to Gynecology   Vaginal discharge       vaginal swab collected and sent to lab, will treat accordingly.   Relevant Orders   Cervicovaginal ancillary only   POCT urine  pregnancy (Completed)        Follow up plan: Return if symptoms worsen or fail to improve.

## 2023-02-15 NOTE — Telephone Encounter (Signed)
Copied from [place] [id number]. Topic: General - Other >> Feb 15, 2023  4:16 PM Ludger Nutting wrote: The pathology lab is sending back patient's Cervicovaginal ancillary sample due to the name not being on the tube.

## 2023-02-16 LAB — URINE CULTURE
MICRO NUMBER:: 14659394
SPECIMEN QUALITY:: ADEQUATE

## 2023-02-17 ENCOUNTER — Ambulatory Visit: Payer: Self-pay | Admitting: *Deleted

## 2023-02-17 NOTE — Telephone Encounter (Signed)
Patient called to review lab results from 02/15/23. Does not understand results when reviewing via My Chart. No documentation noted from provider regarding results. Please advise and call back.

## 2023-02-20 ENCOUNTER — Other Ambulatory Visit: Payer: Self-pay | Admitting: Internal Medicine

## 2023-02-20 DIAGNOSIS — N760 Acute vaginitis: Secondary | ICD-10-CM

## 2023-02-20 LAB — MOLECULAR ANCILLARY ONLY
Bacterial Vaginitis (gardnerella): POSITIVE — AB
Candida Glabrata: NEGATIVE
Candida Vaginitis: NEGATIVE
Chlamydia: NEGATIVE
Comment: NEGATIVE
Comment: NEGATIVE
Comment: NEGATIVE
Comment: NEGATIVE
Comment: NEGATIVE
Comment: NORMAL
Neisseria Gonorrhea: NEGATIVE
Trichomonas: NEGATIVE

## 2023-02-20 MED ORDER — METRONIDAZOLE 500 MG PO TABS: 500.0000 mg | ORAL_TABLET | Freq: Two times a day (BID) | ORAL | 0 refills | Status: AC

## 2023-02-20 NOTE — Telephone Encounter (Signed)
Called and left voicemail informing patient and checking in on symptoms.

## 2023-02-20 NOTE — Telephone Encounter (Signed)
Called x2

## 2023-03-01 ENCOUNTER — Encounter: Payer: Self-pay | Admitting: Obstetrics

## 2023-03-01 ENCOUNTER — Ambulatory Visit (INDEPENDENT_AMBULATORY_CARE_PROVIDER_SITE_OTHER): Payer: BC Managed Care – PPO | Admitting: Obstetrics

## 2023-03-01 VITALS — BP 105/77 | HR 85 | Ht 68.0 in | Wt 188.0 lb

## 2023-03-01 DIAGNOSIS — Z30430 Encounter for insertion of intrauterine contraceptive device: Secondary | ICD-10-CM

## 2023-03-01 DIAGNOSIS — Z3202 Encounter for pregnancy test, result negative: Secondary | ICD-10-CM

## 2023-03-01 DIAGNOSIS — Z3040 Encounter for surveillance of contraceptives, unspecified: Secondary | ICD-10-CM

## 2023-03-01 LAB — POCT URINE PREGNANCY: Preg Test, Ur: NEGATIVE

## 2023-03-01 MED ORDER — LEVONORGESTREL 20 MCG/DAY IU IUD
1.0000 | INTRAUTERINE_SYSTEM | Freq: Once | INTRAUTERINE | Status: AC
  Administered 2023-03-01: 1 via INTRAUTERINE

## 2023-03-01 NOTE — Progress Notes (Signed)
ENCOUNTER FOR IUD INSERTION   Subjective  Patricia Guerrero is a 37 y.o. [id number] who presents today for IUD insertion. She desires reversible long-term contraception. We have thoroughly reviewed the risks, benefits, and alternatives, and she has elected to proceed with Mirena insertion.   Objective BP 105/77   Pulse 85   Ht 5\' 8"  (1.727 m)   Wt 188 lb (85.3 kg)   LMP  (LMP Unknown)   BMI 28.59 kg/m   UPT: negative  Pelvic: external genitalia normal; vagina normal without discharge; uterus anteverted; cervix normal in appearance without discharge or lesions.  Procedure Note Consent was obtained prior to the procedure. A bimanual exam was performed to determine the position of the uterus. A sterile speculum was placed in the vagina, and the cervix was visualized. Betadine was applied to the cervix. 4% topical lidocaine was applied to the cervix and cervical os. A single-toothed Allis was placed on the anterior lip of the cervix, and gentle traction was applied to straighten and stabilize it. The uterus was sounded to about 6.5 cm. The IUD was inserted to the appropriate depth and the insertion tool was removed. The strings were trimmed to about 3 cm. Bleeding was minimal. [name] tolerated the procedure well. Post-procedure care and warning signs were reviewed with [name].  Follow up for annual visit or PRN.  Lloyd Huger, CNM

## 2023-03-02 ENCOUNTER — Encounter: Payer: Self-pay | Admitting: Obstetrics

## 2023-05-02 ENCOUNTER — Encounter (HOSPITAL_COMMUNITY): Payer: Self-pay | Admitting: Emergency Medicine

## 2023-05-02 ENCOUNTER — Other Ambulatory Visit: Payer: Self-pay

## 2023-05-02 ENCOUNTER — Emergency Department (HOSPITAL_COMMUNITY)
Admission: EM | Admit: 2023-05-02 | Discharge: 2023-05-02 | Disposition: A | Discharge status: Home / Self Care | Payer: BC Managed Care – PPO | Attending: Emergency Medicine | Admitting: Emergency Medicine

## 2023-05-02 DIAGNOSIS — R040 Epistaxis: Secondary | ICD-10-CM | POA: Diagnosis not present

## 2023-05-02 LAB — CBC
HCT: 40.2 % (ref 36.0–46.0)
Hemoglobin: 13.2 g/dL (ref 12.0–15.0)
MCH: 27 pg (ref 26.0–34.0)
MCHC: 32.8 g/dL (ref 30.0–36.0)
MCV: 82.2 fL (ref 80.0–100.0)
Platelets: 282 10*3/uL (ref 150–400)
RBC: 4.89 MIL/uL (ref 3.87–5.11)
RDW: 13 % (ref 11.5–15.5)
WBC: 8.4 10*3/uL (ref 4.0–10.5)
nRBC: 0 % (ref 0.0–0.2)

## 2023-05-02 MED ORDER — OXYMETAZOLINE HCL 0.05 % NA SOLN
1.0000 | Freq: Once | NASAL | Status: AC
  Administered 2023-05-02: 1 via NASAL
  Filled 2023-05-02: qty 30

## 2023-05-02 NOTE — ED Notes (Signed)
Patient verbalizes understanding of discharge instructions. Opportunity for questioning and answers were provided. Armband removed by staff, pt discharged from ED. Ambulated out to lobby  

## 2023-05-02 NOTE — ED Provider Notes (Signed)
  [place] EMERGENCY DEPARTMENT AT [facility] Provider Note   CSN: [id number] Arrival date & time: 05/02/23  0404     History  Chief Complaint  Patient presents with   Epistaxis    Patricia Guerrero is a 37 y.o. female.  37 year old female with left side nose bleed. Reports this is the 3rd nose bleed she has had in the past week. History of nosebleeds years ago, none since. No trauma, not on thinners, no HTN hx, no headaches. Has tried applying pressure at home without improvement.        Home Medications Prior to Admission medications   Not on File      Allergies    Patient has no known allergies.    Review of Systems   Review of Systems Negative except as per HPI Physical Exam Updated Vital Signs BP (!) 125/95 (BP Location: Right Arm)   Pulse 78   Temp 98.1 F (36.7 C)   Resp 18   Wt 80.7 kg   SpO2 98%   BMI 27.06 kg/m  Physical Exam Vitals and nursing note reviewed.  Constitutional:      General: She is not in acute distress.    Appearance: She is well-developed. She is not diaphoretic.  HENT:     Head: Normocephalic and atraumatic.     Nose:     Right Nostril: No foreign body, epistaxis, septal hematoma or occlusion.     Left Nostril: No foreign body, epistaxis, septal hematoma or occlusion.  Pulmonary:     Effort: Pulmonary effort is normal.  Neurological:     Mental Status: She is alert and oriented to person, place, and time.  Psychiatric:        Behavior: Behavior normal.     ED Results / Procedures / Treatments   Labs (all labs ordered are listed, but only abnormal results are displayed) Labs Reviewed  CBC    EKG None  Radiology No results found.  Procedures Procedures    Medications Ordered in ED Medications  oxymetazoline (AFRIN) 0.05 % nasal spray 1 spray (1 spray Each Nare Given 05/02/23 0430)    ED Course/ Medical Decision Making/ A&P                             Medical Decision Making Amount and/or  Complexity of Data Reviewed Labs: ordered.  Risk OTC drugs.   38 yo female with recurrent left side epistaxis. Afrin by triage, at time of my exam 15 min later, bleeding has resolved, no further bleeding. Discussed care with afrin and pressure at home, will dc home with Afrin. CBC WNL.         Final Clinical Impression(s) / ED Diagnoses Final diagnoses:  Epistaxis    Rx / DC Orders ED Discharge Orders     None         Alden Hipp 05/02/23 0452    Nira Conn, MD 05/02/23 2115

## 2023-05-02 NOTE — Discharge Instructions (Addendum)
If bleeding returns: - blow nose to remove any clot - spray Afrin both sides - pinch the soft part of the nose closed and hold pressure for 15 minutes straight (don't let go to check!)  If you continue to have nosebleeds, follow up with ENT, call to schedule an appointment.

## 2023-05-02 NOTE — ED Triage Notes (Addendum)
Pt in with nosebleed, ongoing since 3:45pm yesterday afternoon. Pt states this is her 3rd nosebleed in a week, and she was able to stop the bleeding with rest and pressure at home with the previous bleeds. Appears to be bleeding from L nare only. No thinners, BP 125/95 in triage. Reporting some sob, able to swallow fine and speak in full sentences

## 2023-05-02 NOTE — ED Notes (Signed)
Pt given Afrin spray, pressure and new gauze applied at this time. Pt continuing to hold pressure

## 2023-10-23 ENCOUNTER — Ambulatory Visit: Payer: Self-pay

## 2023-10-23 NOTE — Telephone Encounter (Signed)
Chief Complaint: Decreased hearing left ear Symptoms: mild pain Frequency: Onset Friday Pertinent Negatives: Patient denies other symptoms Disposition: [] ED /[] Urgent Care (no appt availability in office) / [x] Appointment(In office/virtual)/ []  [facility]/ [] Home Care/ [] Refused Recommended Disposition /[] [facility]/ []  Follow-up with PCP Additional Notes: Patient says she thinks it's earwax buildup as this has happened before. She says the ear is irritated now from her picking at it, no drainage, mild pain. OV scheduled tomorrow.   Reason for Disposition  [1] Decreased hearing in 1 ear AND [2] gradual onset  Answer Assessment - Initial Assessment Questions 1. DESCRIPTION: "What type of hearing problem are you having? Describe it for me." (e.g., complete hearing loss, partial loss)     Partial loss 2. LOCATION: "One or both ears?" If one, ask: "Which ear?"     Left ear 3. SEVERITY: "Can you hear anything?" If Yes, ask: "What can you hear?" (e.g., ticking watch, whisper, talking)   - MILD:  Difficulty hearing soft speech, quiet library sounds, or speech from a distance or over background noise.   - MODERATE: Difficulty hearing normal speech even at closed distances.   - SEVERE: Unable to hear most normal conversation and talking; only able to hear loud sounds such as an alarm clock.     Moderate 4. ONSET: "When did this begin?" "Did it start suddenly or come on gradually?"     Started on Friday 5. PATTERN: "Does this come and go, or has it been constant since it started?"     Constant 6. PAIN: "Is there any pain in your ear(s)?"  (Scale 1-10; or mild, moderate, severe)   - NONE (0): no pain   - MILD (1-3): doesn't interfere with normal activities    - MODERATE (4-7): interferes with normal activities or awakens from sleep    - SEVERE (8-10): excruciating pain, unable to do any normal activities      Mild 7. CAUSE: "What do you think is causing this hearing  problem?"     Possible wax, maybe ear infection, not sure 8. OTHER SYMPTOMS: "Do you have any other symptoms?" (e.g., dizziness, ringing in ears)     No  Protocols used: Hearing Loss or Change-A-AH

## 2023-10-23 NOTE — Telephone Encounter (Signed)
Patient called back to verify which appointment she wants as it was already scheduled at 1340 and I wasn't aware when I scheduled at 1320. She says to keep the 1340 so she can leave work later.

## 2023-10-23 NOTE — Progress Notes (Unsigned)
Name: Patricia Guerrero   MRN: [id number]    DOB: 06/03/1986   Date:10/24/2023       Progress Note  Subjective  Chief Complaint  Decreased Hearing  HPI  Ear fullness: she noticed left ear fullness four days ago, no cold symptoms , fever or chills, but when "messing with the ear it causes some pain". She used a large syringe last night and removed some wax but still has some ear fulness.   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  No Known Allergies  I personally reviewed active problem list, medication list, allergies, family history, social history, health maintenance with the patient/caregiver today.   ROS  Ten systems reviewed and is negative except as mentioned in HPI    Objective  Vitals:   10/24/23 1342  BP: 116/72  Pulse: 90  Resp: 14  Temp: 98 F (36.7 C)  TempSrc: Oral  SpO2: 99%  Weight: 202 lb 1.6 oz (91.7 kg)  Height: 5\' 9"  (1.753 m)    Body mass index is 29.84 kg/m.  Physical Exam  Constitutional: Patient appears well-developed and well-nourished.  No distress.  HEENT: head atraumatic, normocephalic, pupils equal and reactive to light, ears cerumen deep in ear canal , neck supple, throat within normal limits Cardiovascular: Normal rate, regular rhythm and normal heart sounds.  No murmur heard. No BLE edema. Pulmonary/Chest: Effort normal and breath sounds normal. No respiratory distress. Abdominal: Soft.  There is no  tenderness. Psychiatric: Patient has a normal mood and affect. behavior is normal. Judgment and thought content normal.   PHQ2/9:    10/24/2023    1:44 PM 02/15/2023    8:32 AM 06/21/2022    3:01 PM 01/08/2021    8:45 AM 01/02/2020    8:36 AM  Depression screen PHQ 2/9  Decreased Interest 0 0 0 0 0  Down, Depressed, Hopeless 0 0 0 0 0  PHQ - 2 Score 0 0 0 0 0  Altered sleeping 0  0 0 1  Tired, decreased energy 0  0 1 0  Change in appetite 0  0 0 0  Feeling bad or failure about yourself  0  0 0 0  Trouble concentrating 0  0 0 0  Moving slowly or fidgety/restless 0  0 0 0  Suicidal thoughts 0  0 0 0  PHQ-9 Score 0  0 1 1  Difficult doing work/chores   Not difficult at all  Not difficult at all    phq 9 is negative   Fall Risk:    10/24/2023    1:44 PM 02/15/2023    8:32 AM 06/21/2022    3:01 PM 01/08/2021    8:45 AM 01/02/2020    8:36 AM  Fall Risk   Falls in the past year? 0 0 0 0 0  Number falls in past yr:  0 0  0 0  Injury with Fall?  0 0 0 0  Risk for fall due to : No Fall Risks      Follow up Falls prevention discussed          Functional Status Survey: Is the patient deaf or have difficulty hearing?: No Does the patient have difficulty seeing, even when wearing glasses/contacts?: No Does the patient have difficulty concentrating, remembering, or making decisions?: No Does the patient have difficulty walking or climbing stairs?: No Does the patient have difficulty dressing or bathing?: No Does the patient have difficulty doing errands alone such as visiting a doctor's office or shopping?: No    Assessment & Plan  1. Ear fullness, left   2. Impacted cerumen of left ear  Verbal consent given Possible side effects discussed with patient Left ear was   lavaged with warm water and peroxide  Patient tolerated procedure well No complications

## 2023-10-24 ENCOUNTER — Encounter: Payer: Self-pay | Admitting: Family Medicine

## 2023-10-24 ENCOUNTER — Ambulatory Visit (INDEPENDENT_AMBULATORY_CARE_PROVIDER_SITE_OTHER): Payer: BC Managed Care – PPO | Admitting: Family Medicine

## 2023-10-24 ENCOUNTER — Ambulatory Visit: Payer: BC Managed Care – PPO | Admitting: Family Medicine

## 2023-10-24 VITALS — BP 116/72 | HR 90 | Temp 98.0°F | Resp 14 | Ht 69.0 in | Wt 202.1 lb

## 2023-10-24 DIAGNOSIS — H938X2 Other specified disorders of left ear: Secondary | ICD-10-CM | POA: Diagnosis not present

## 2023-10-24 DIAGNOSIS — H6122 Impacted cerumen, left ear: Secondary | ICD-10-CM | POA: Diagnosis not present

## 2024-09-04 ENCOUNTER — Ambulatory Visit: Payer: Self-pay | Admitting: *Deleted

## 2024-09-04 NOTE — Telephone Encounter (Signed)
 Message from [facility] sent at 09/04/2024  9:30 AM EDT  Athletes feet ( worsening ) feet are itching and flaky    Call History  Contact Date/Time Type Contact Phone/Fax By  09/04/2024 09:27 AM EDT Phone (Incoming) Guadamuz, Atley L (Self)  Clemetine Ole PARAS

## 2024-09-04 NOTE — Telephone Encounter (Signed)
 FYI Only or Action Required?: FYI only for provider.  Patient was last seen in primary care on 10/24/2023 by Glenard Mire, MD.  Called Nurse Triage reporting Foot Problem.  Symptoms began several weeks ago.  Interventions attempted: Rest, hydration, or home remedies.  Symptoms are: unchanged.  Triage Disposition: See PCP When Office is Open (Within 3 Days)  Patient/caregiver understands and will follow disposition?:   Athletes feet ( worsening ) feet are itching and flaky    Reason for Disposition  Rash has spread beyond the instep and toes  Answer Assessment - Initial Assessment Questions Scheduled for an appointment on 09/06/2024 at 3:40 PM  1. APPEARANCE of RASH: What does the rash look like?      Flaky skin 2. LOCATION: Which part of the foot is involved? Are both feet involved?      Both feet -top of the foot at the toes. Patient states left foot is worse than right. Flaky skin to both feet-bottom of feet and at heels.  3. SIZE: How large is the infected area? (Inches or centimeters)      Both feet 4. ONSET: When did the rash start?     Has been going on for awhile.  5. OTHER SYMPTOMS: Do you have any other symptoms? (e.g., fever)     Discoloration to toenails 6. PREGNANCY: Is there any chance you are pregnant? When was your last menstrual period?     no  Protocols used: Athlete's Foot-A-AH

## 2024-09-04 NOTE — Telephone Encounter (Signed)
 First attempt to return her call.  Voice mailbox is full so unable to leave a message.   Routed back to triage queue for further attempts.

## 2024-09-06 ENCOUNTER — Ambulatory Visit (INDEPENDENT_AMBULATORY_CARE_PROVIDER_SITE_OTHER): Payer: Self-pay | Admitting: Internal Medicine

## 2024-09-06 ENCOUNTER — Encounter: Payer: Self-pay | Admitting: Internal Medicine

## 2024-09-06 VITALS — BP 118/72 | HR 107 | Resp 18 | Ht 69.0 in | Wt 202.7 lb

## 2024-09-06 DIAGNOSIS — B353 Tinea pedis: Secondary | ICD-10-CM

## 2024-09-06 MED ORDER — KETOCONAZOLE 2 % EX CREA: 1.0000 | TOPICAL_CREAM | Freq: Every day | CUTANEOUS | 1 refills | Status: AC

## 2024-09-06 NOTE — Progress Notes (Signed)
   Acute Office Visit  Subjective:     Patient ID: Patricia Guerrero, female    DOB: 07-12-1986, 38 y.o.   MRN: [id number]  Chief Complaint  Patient presents with   Tinea Pedis    bilateral    HPI Patient is in today for athlete's foot. She is a patient of Dr. Glenard and this is my first time I'm meeting her.   Discussed the use of AI scribe software for clinical note transcription with the patient, who gave verbal consent to proceed.  History of Present Illness Patricia Guerrero is a 38 year old female who presents with nail changes and itching on the feet.  She experiences itching and flaky skin on the bottom of her feet, which has progressively worsened. The condition is more prominent on the skin than the nails. The third nail on both feet feels different, with the left foot being more affected. The skin between her toes is red and sometimes burns when scratched. She has not used any treatments for this condition.    Review of Systems  Skin:  Positive for itching and rash.        Objective:    BP 118/72   Pulse (!) 107   Resp 18   Ht 5' 9 (1.753 m)   Wt 202 lb 11.2 oz (91.9 kg)   SpO2 98%   BMI 29.93 kg/m    Physical Exam Constitutional:      Appearance: Normal appearance.  HENT:     Head: Normocephalic and atraumatic.  Eyes:     Conjunctiva/sclera: Conjunctivae normal.  Cardiovascular:     Rate and Rhythm: Normal rate and regular rhythm.  Pulmonary:     Effort: Pulmonary effort is normal.     Breath sounds: Normal breath sounds.  Feet:     Right foot:     Skin integrity: Dry skin present.     Toenail Condition: Right toenails are abnormally thick.     Left foot:     Skin integrity: Dry skin present.     Toenail Condition: Left toenails are normal.     Comments: Third digit slightly thick but otherwise normal  Skin:    General: Skin is warm and dry.  Neurological:     General: No focal deficit present.     Mental Status: She is alert. Mental  status is at baseline.  Psychiatric:        Mood and Affect: Mood normal.        Behavior: Behavior normal.     No results found for any visits on 09/06/24.      Assessment & Plan:   Assessment & Plan Tinea pedis with onychomycosis Chronic tinea pedis with onychomycosis affecting interdigital skin and third toenail on the right. Mild condition with slight nail thickening and skin redness. Oral antifungals reserved due to liver toxicity risk. - Prescribed anti-fungal cream for interdigital application. - Recommended over-the-counter Lamisil polish for nails. - Advised breathable footwear and avoiding thick socks. - Instructed to dry feet thoroughly post-shower. - Advised daily topical treatment application for several weeks. - Instructed to report worsening or non-improvement for potential oral medication consideration.  - ketoconazole  (NIZORAL ) 2 % cream; Apply 1 Application topically daily.  Dispense: 60 g; Refill: 1   Return if symptoms worsen or fail to improve.  Sharyle Fischer, DO

## 2024-10-01 NOTE — Progress Notes (Deleted)
 ANNUAL GYNECOLOGICAL EXAM  SUBJECTIVE  HPI  Patricia Guerrero is a 38 y.o.-year-old [id number] who presents for an annual gynecological exam today.  She denies pelvic pain, dyspareunia, abnormal vaginal bleeding or discharge, and UTI symptoms. ***  Medical/Surgical History Past Medical History:  Diagnosis Date   Asthma    IUD (intrauterine device) in place 08/22/2018   Mirena  placed January 03, 2018   Past Surgical History:  Procedure Laterality Date   NO PAST SURGERIES      Social History Lives with ***. ***Feels safe there Work: Exercise: Substances: ***EtOH, tobacco, vape, and recreational drugs  Obstetric History OB History     Gravida  6   Para  4   Term  4   Preterm  0   AB  2   Living  4      SAB  0   IAB  1   Ectopic  0   Multiple  0   Live Births               GYN/Menstrual History No LMP recorded. {Regular/irregular menstrual period abdominal pain hpi md:[id number]} Last Pap: Contraception:  Prevention Dentist Eye exam Mammogram Colonoscopy Flu shot/vaccines  Current Medications Outpatient Medications Prior to Visit  Medication Sig   ketoconazole  (NIZORAL ) 2 % cream Apply 1 Application topically daily.   No facility-administered medications prior to visit.        ROS Constitutional: Denied constitutional symptoms, night sweats, recent illness, fatigue, fever, insomnia and weight loss.  Eyes: Denied eye symptoms, eye pain, photophobia, vision change and visual disturbance.  Ears/Nose/Throat/Neck: Denied ear, nose, throat or neck symptoms, hearing loss, nasal discharge, sinus congestion and sore throat.  Cardiovascular: Denied cardiovascular symptoms, arrhythmia, chest pain/pressure, edema, exercise intolerance, orthopnea and palpitations.  Respiratory: Denied pulmonary symptoms, asthma, pleuritic pain, productive sputum, cough, dyspnea and wheezing.  Gastrointestinal: Denied gastro-esophageal reflux, melena, nausea and  vomiting.  Genitourinary:*** Denied genitourinary symptoms including symptomatic vaginal discharge, pelvic relaxation issues, and urinary complaints.  Musculoskeletal: Denied musculoskeletal symptoms, stiffness, swelling, muscle weakness and myalgia.  Dermatologic: Denied dermatology symptoms, rash and scar.  Neurologic: Denied neurology symptoms, dizziness, headache, neck pain and syncope.  Psychiatric: Denied psychiatric symptoms, anxiety and depression.  Endocrine: Denied endocrine symptoms including hot flashes and night sweats.    OBJECTIVE  There were no vitals taken for this visit.   Physical examination General NAD, Conversant  HEENT Atraumatic; Op clear with mmm.  Normo-cephalic. Pupils reactive. Anicteric sclerae  Thyroid/Neck Smooth without nodularity or enlargement. Normal ROM.  Neck Supple.  Skin No rashes, lesions or ulceration. Normal palpated skin turgor. No nodularity.  Breasts: No masses or discharge.  Symmetric.  No axillary adenopathy.  Lungs: Clear to auscultation.No rales or wheezes. Normal Respiratory effort, no retractions.  Heart: NSR.  No murmurs or rubs appreciated. No peripheral edema  Abdomen: Soft.  Non-tender.  No masses.  No HSM. No hernia  Extremities: Moves all appropriately.  Normal ROM for age. No lymphadenopathy.  Neuro: Oriented to PPT.  Normal mood. Normal affect.     Pelvic:   Vulva: Normal appearance.  No lesions.  Vagina: No lesions or abnormalities noted.  Support: Normal pelvic support.  Urethra No masses tenderness or scarring.  Meatus Normal size without lesions or prolapse.  Cervix: Normal appearance.  No lesions.  Anus: Normal exam.  No lesions.  Perineum: Normal exam.  No lesions.        Bimanual   Uterus: Normal size.  Non-tender.  Mobile.  AV.  Adnexae: No masses.  Non-tender to palpation.  Cul-de-sac: Negative for abnormality.    ASSESSMENT  1) Annual exam  PLAN 1) Physical exam as noted. Discussed healthy lifestyle  choices and preventive care. 2) 3) Return in one year for annual exam or as needed for concerns.   Melissa Swanson, CNM

## 2024-10-02 ENCOUNTER — Ambulatory Visit: Payer: Self-pay | Admitting: Obstetrics

## 2024-10-02 DIAGNOSIS — A6000 Herpesviral infection of urogenital system, unspecified: Secondary | ICD-10-CM

## 2024-10-02 DIAGNOSIS — Z0000 Encounter for general adult medical examination without abnormal findings: Secondary | ICD-10-CM

## 2025-01-08 ENCOUNTER — Ambulatory Visit (INDEPENDENT_AMBULATORY_CARE_PROVIDER_SITE_OTHER): Admitting: Family Medicine

## 2025-01-08 ENCOUNTER — Encounter: Payer: Self-pay | Admitting: Family Medicine

## 2025-01-08 VITALS — BP 126/78 | HR 75 | Temp 97.3°F | Resp 16 | Ht 69.0 in | Wt 201.3 lb

## 2025-01-08 DIAGNOSIS — R430 Anosmia: Secondary | ICD-10-CM

## 2025-01-08 DIAGNOSIS — J4521 Mild intermittent asthma with (acute) exacerbation: Secondary | ICD-10-CM

## 2025-01-08 DIAGNOSIS — B349 Viral infection, unspecified: Secondary | ICD-10-CM

## 2025-01-08 DIAGNOSIS — R059 Cough, unspecified: Secondary | ICD-10-CM

## 2025-01-08 LAB — POC COVID19/FLU A&B COMBO
Covid Antigen, POC: NEGATIVE
Influenza A Antigen, POC: NEGATIVE
Influenza B Antigen, POC: NEGATIVE

## 2025-01-08 MED ORDER — BENZONATATE 100 MG PO CAPS: 100.0000 mg | ORAL_CAPSULE | Freq: Three times a day (TID) | ORAL | 0 refills | Status: AC | PRN

## 2025-01-08 MED ORDER — AIRSUPRA 90-80 MCG/ACT IN AERO: 2.0000 | INHALATION_SPRAY | Freq: Four times a day (QID) | RESPIRATORY_TRACT | 1 refills | Status: AC

## 2025-01-08 NOTE — Progress Notes (Signed)
 Name: ZYON GROUT   MRN: [id number]    DOB: 02-18-1986   Date:01/08/2025       Progress Note  Subjective  Chief Complaint  Chief Complaint  Patient presents with   Cough    No smell/taste, daughter was positive today for flu, sx since sunday    Discussed the use of AI scribe software for clinical note transcription with the patient, who gave verbal consent to proceed.  History of Present Illness KRISTYNA BRADSTREET is a 39 year old female with asthma who presents with flu-like symptoms and loss of taste and smell.  She has been experiencing flu-like symptoms since Sunday, including runny nose, fever, and cough. The fever has resolved, but she continues to experience a runny nose and cough.  On Tuesday, she noticed a loss of taste and smell, which she describes as her most concerning symptom. This symptom is intermittent, described as 'coming and going'.  She experiences fatigue but maintains her appetite, eating out of desire rather than necessity.  Her daughter tested positive for the flu earlier today, but her own symptoms began prior to her daughter's diagnosis. She has not received a pneumonia or flu vaccination this season.  She has a history of asthma and reports wheezing during physical activity, especially when unwell. She does not have albuterol  at home.  She has been using Tylenol for symptom relief. She does not smoke.  She has missed work due to her illness, taking off yesterday and today.    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    Physical Exam CONSTITUTIONAL: Patient appears well-developed and well-nourished. No distress. HEENT: Head atraumatic, normocephalic, neck supple. CARDIOVASCULAR: Normal rate, regular rhythm and normal heart sounds. No murmur heard. No BLE edema. PULMONARY: Effort normal and breath sounds normal. Lungs clear to auscultation. No respiratory distress. ABDOMINAL: There is no tenderness or distention. MUSCULOSKELETAL: Normal gait. Without gross motor or sensory deficit. PSYCHIATRIC: Patient has a normal mood and affect. Behavior is normal. Judgment and thought content normal.  Recent Results (from the past 2160 hours)  POC Covid19/Flu A&B Antigen     Status: None   Collection Time: 01/08/25 10:58 AM  Result Value Ref Range   Influenza A Antigen, POC Negative Negative   Influenza B Antigen, POC Negative Negative   Covid Antigen, POC Negative Negative      Assessment & Plan Acute viral upper respiratory infection Symptoms suggest viral illness, possibly COVID-19, given anosmia and fatigue. Outside treatment window for antivirals. - Continue symptomatic treatment with fluids and rest. - Use masks to prevent exposure to others. - Prescribed cough medication. - Continue Tylenol for symptom relief.  Mild intermittent asthma with acute exacerbation Asthma exacerbation likely triggered by viral illness. Air Supra prescribed for its corticosteroid component to reduce  lung inflammation. - Prescribed Air Supra for wheezing. - Instructed to write the date of opening on the [employer] box and discard after 60 days. - Sent prescription to pharmacy.           [1]  Current Outpatient Medications:    ketoconazole  (NIZORAL ) 2 % cream, Apply 1 Application topically daily., Disp: 60 g, Rfl: 1 [2] No Known Allergies

## 2025-01-09 ENCOUNTER — Other Ambulatory Visit (HOSPITAL_COMMUNITY): Payer: Self-pay

## 2025-01-09 ENCOUNTER — Telehealth: Payer: Self-pay | Admitting: Pharmacy Technician

## 2025-01-09 NOTE — Telephone Encounter (Signed)
 Pharmacy Patient Advocate Encounter   Received notification from [facility] KEY that prior authorization for Airsupra  90-80MCG/ACT aerosol is required/requested.   Insurance verification completed.   The patient is insured through [employer].   Per test claim: PA required; PA started via CoverMyMeds. KEY [id number] . Waiting for clinical questions to populate.

## 2025-01-09 NOTE — Telephone Encounter (Signed)
 Pharmacy Patient Advocate Encounter  Received notification from [facility] that Prior Authorization for Airsupra  90-80MCG/ACT aerosol  has been APPROVED from 01/09/25 to 01/09/26. Ran test claim, Copay is $35.00. This test claim was processed through [facility]- copay amounts may vary at other pharmacies due to pharmacy/plan contracts, or as the patient moves through the different stages of their insurance plan.   PA #/Case ID/Reference #: [id number]
